# Patient Record
Sex: Female | Born: 1995 | Hispanic: No | Marital: Married | Smoking: Never smoker
Health system: Southern US, Community
[De-identification: ages and names within clinical notes are randomized; demographics above are authoritative.]

## PROBLEM LIST (undated history)

## (undated) ENCOUNTER — Inpatient Hospital Stay (HOSPITAL_COMMUNITY): Payer: Self-pay

## (undated) DIAGNOSIS — Z9289 Personal history of other medical treatment: Secondary | ICD-10-CM

## (undated) DIAGNOSIS — D582 Other hemoglobinopathies: Secondary | ICD-10-CM

## (undated) DIAGNOSIS — F419 Anxiety disorder, unspecified: Secondary | ICD-10-CM

## (undated) DIAGNOSIS — D649 Anemia, unspecified: Secondary | ICD-10-CM

## (undated) DIAGNOSIS — Z5189 Encounter for other specified aftercare: Secondary | ICD-10-CM

## (undated) HISTORY — DX: Personal history of other medical treatment: Z92.89

## (undated) HISTORY — DX: Anemia, unspecified: D64.9

## (undated) HISTORY — PX: NO PAST SURGERIES: SHX2092

---
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
## 2014-01-16 ENCOUNTER — Encounter: Payer: Self-pay | Admitting: Obstetrics & Gynecology

## 2014-01-17 ENCOUNTER — Encounter: Payer: Self-pay | Admitting: *Deleted

## 2014-01-17 DIAGNOSIS — Z9289 Personal history of other medical treatment: Secondary | ICD-10-CM | POA: Insufficient documentation

## 2014-01-17 DIAGNOSIS — D582 Other hemoglobinopathies: Secondary | ICD-10-CM | POA: Insufficient documentation

## 2014-01-17 DIAGNOSIS — Z6791 Unspecified blood type, Rh negative: Secondary | ICD-10-CM

## 2014-01-17 DIAGNOSIS — D573 Sickle-cell trait: Secondary | ICD-10-CM

## 2014-01-17 DIAGNOSIS — Z862 Personal history of diseases of the blood and blood-forming organs and certain disorders involving the immune mechanism: Secondary | ICD-10-CM

## 2014-01-17 DIAGNOSIS — O26899 Other specified pregnancy related conditions, unspecified trimester: Secondary | ICD-10-CM

## 2014-01-17 HISTORY — DX: Personal history of other medical treatment: Z92.89

## 2014-02-08 ENCOUNTER — Encounter: Payer: Self-pay | Admitting: Obstetrics & Gynecology

## 2014-02-08 ENCOUNTER — Ambulatory Visit (INDEPENDENT_AMBULATORY_CARE_PROVIDER_SITE_OTHER): Payer: Medicaid Other | Admitting: Obstetrics & Gynecology

## 2014-02-08 VITALS — BP 112/77 | HR 103 | Temp 98.6°F | Ht 66.0 in | Wt 159.4 lb

## 2014-02-08 DIAGNOSIS — O360990 Maternal care for other rhesus isoimmunization, unspecified trimester, not applicable or unspecified: Secondary | ICD-10-CM

## 2014-02-08 DIAGNOSIS — O26899 Other specified pregnancy related conditions, unspecified trimester: Principal | ICD-10-CM

## 2014-02-08 DIAGNOSIS — Z6791 Unspecified blood type, Rh negative: Secondary | ICD-10-CM

## 2014-02-08 DIAGNOSIS — Z23 Encounter for immunization: Secondary | ICD-10-CM

## 2014-02-08 LAB — POCT URINALYSIS DIP (DEVICE)
Bilirubin Urine: NEGATIVE
GLUCOSE, UA: NEGATIVE mg/dL
Ketones, ur: NEGATIVE mg/dL
Leukocytes, UA: NEGATIVE
NITRITE: NEGATIVE
PROTEIN: NEGATIVE mg/dL
Specific Gravity, Urine: 1.015 (ref 1.005–1.030)
UROBILINOGEN UA: 1 mg/dL (ref 0.0–1.0)
pH: 8.5 — ABNORMAL HIGH (ref 5.0–8.0)

## 2014-02-08 NOTE — Progress Notes (Signed)
Here for initial visit. [facility]. Given new patient information. Discussed BMI/ appropriate weight gain.

## 2014-02-08 NOTE — Progress Notes (Signed)
[facility]. [facility] information given. [facility] Eino FarberWalidah Jerolyn CenterMuhammad, CNM talked to patient about specifics of having a waterbirth Declines Tdap vaccine for today, may reconsider later No other complaints or concerns.  Fetal movement and labor precautions reviewed.

## 2014-02-08 NOTE — Patient Instructions (Signed)
Return to clinic for any obstetric concerns or go to [facility] for evaluation  

## 2014-02-16 ENCOUNTER — Encounter: Payer: Self-pay | Admitting: General Practice

## 2014-02-23 ENCOUNTER — Ambulatory Visit (INDEPENDENT_AMBULATORY_CARE_PROVIDER_SITE_OTHER): Payer: Medicaid Other | Admitting: Advanced Practice Midwife

## 2014-02-23 VITALS — BP 120/72 | HR 99 | Temp 97.9°F | Wt 168.7 lb

## 2014-02-23 DIAGNOSIS — Z6791 Unspecified blood type, Rh negative: Secondary | ICD-10-CM

## 2014-02-23 DIAGNOSIS — O360990 Maternal care for other rhesus isoimmunization, unspecified trimester, not applicable or unspecified: Secondary | ICD-10-CM

## 2014-02-23 DIAGNOSIS — O26899 Other specified pregnancy related conditions, unspecified trimester: Principal | ICD-10-CM

## 2014-02-23 LAB — POCT URINALYSIS DIP (DEVICE)
Bilirubin Urine: NEGATIVE
Glucose, UA: NEGATIVE mg/dL
Ketones, ur: NEGATIVE mg/dL
Leukocytes, UA: NEGATIVE
Nitrite: NEGATIVE
PH: 7.5 (ref 5.0–8.0)
PROTEIN: NEGATIVE mg/dL
SPECIFIC GRAVITY, URINE: 1.015 (ref 1.005–1.030)
UROBILINOGEN UA: 1 mg/dL (ref 0.0–1.0)

## 2014-02-23 LAB — OB RESULTS CONSOLE GC/CHLAMYDIA
Chlamydia: NEGATIVE
Gonorrhea: NEGATIVE

## 2014-02-23 LAB — OB RESULTS CONSOLE GBS: STREP GROUP B AG: NEGATIVE

## 2014-02-23 NOTE — Patient Instructions (Signed)
Braxton Hicks Contractions Pregnancy is commonly associated with contractions of the uterus throughout the pregnancy. Towards the end of pregnancy (32 to 34 weeks), these contractions (Braxton Hicks) can develop more often and may become more forceful. This is not true labor because these contractions do not result in opening (dilatation) and thinning of the cervix. They are sometimes difficult to tell apart from true labor because these contractions can be forceful and people have different pain tolerances. You should not feel embarrassed if you go to the hospital with false labor. Sometimes, the only way to tell if you are in true labor is for your caregiver to follow the changes in the cervix. How to tell the difference between true and false labor:  False labor.  The contractions of false labor are usually shorter, irregular and not as hard as those of true labor.  They are often felt in the front of the lower abdomen and in the groin.  They may leave with walking around or changing positions while lying down.  They get weaker and are shorter lasting as time goes on.  These contractions are usually irregular.  They do not usually become progressively stronger, regular and closer together as with true labor.  True labor.  Contractions in true labor last 30 to 70 seconds, become very regular, usually become more intense, and increase in frequency.  They do not go away with walking.  The discomfort is usually felt in the top of the uterus and spreads to the lower abdomen and low back.  True labor can be determined by your caregiver with an exam. This will show that the cervix is dilating and getting thinner. If there are no prenatal problems or other health problems associated with the pregnancy, it is completely safe to be sent home with false labor and await the onset of true labor. HOME CARE INSTRUCTIONS   Keep up with your usual exercises and instructions.  Take medications as  directed.  Keep your regular prenatal appointment.  Eat and drink lightly if you think you are going into labor.  If BH contractions are making you uncomfortable:  Change your activity position from lying down or resting to walking/walking to resting.  Sit and rest in a tub of warm water.  Drink 2 to 3 glasses of water. Dehydration may cause B-H contractions.  Do slow and deep breathing several times an hour. SEEK IMMEDIATE MEDICAL CARE IF:   Your contractions continue to become stronger, more regular, and closer together.  You have a gushing, burst or leaking of fluid from the vagina.  An oral temperature above 102 F (38.9 C) develops.  You have passage of blood-tinged mucus.  You develop vaginal bleeding.  You develop continuous belly (abdominal) pain.  You have low back pain that you never had before.  You feel the baby's head pushing down causing pelvic pressure.  The baby is not moving as much as it used to. Document Released: 09/07/2005 Document Revised: 11/30/2011 Document Reviewed: 06/19/2013 ExitCare Patient Information 2014 [employer].  Fetal Movement Counts Patient Name: __________________________________________________ Patient Due Date: ____________________ Performing a fetal movement count is highly recommended in high-risk pregnancies, but it is good for every pregnant woman to do. Your caregiver may ask you to start counting fetal movements at 28 weeks of the pregnancy. Fetal movements often increase:  After eating a full meal.  After physical activity.  After eating or drinking something sweet or cold.  At rest. Pay attention to when you feel   the baby is most active. This will help you notice a pattern of your baby's sleep and wake cycles and what factors contribute to an increase in fetal movement. It is important to perform a fetal movement count at the same time each day when your baby is normally most active.  HOW TO COUNT FETAL  MOVEMENTS 1. Find a quiet and comfortable area to sit or lie down on your left side. Lying on your left side provides the best blood and oxygen circulation to your baby. 2. Write down the day and time on a sheet of paper or in a journal. 3. Start counting kicks, flutters, swishes, rolls, or jabs in a 2 hour period. You should feel at least 10 movements within 2 hours. 4. If you do not feel 10 movements in 2 hours, wait 2 3 hours and count again. Look for a change in the pattern or not enough counts in 2 hours. SEEK MEDICAL CARE IF:  You feel less than 10 counts in 2 hours, tried twice.  There is no movement in over an hour.  The pattern is changing or taking longer each day to reach 10 counts in 2 hours.  You feel the baby is not moving as he or she usually does. Date: ____________ Movements: ____________ Start time: ____________ Finish time: ____________  Date: ____________ Movements: ____________ Start time: ____________ Finish time: ____________ Date: ____________ Movements: ____________ Start time: ____________ Finish time: ____________ Date: ____________ Movements: ____________ Start time: ____________ Finish time: ____________ Date: ____________ Movements: ____________ Start time: ____________ Finish time: ____________ Date: ____________ Movements: ____________ Start time: ____________ Finish time: ____________ Date: ____________ Movements: ____________ Start time: ____________ Finish time: ____________ Date: ____________ Movements: ____________ Start time: ____________ Finish time: ____________  Date: ____________ Movements: ____________ Start time: ____________ Finish time: ____________ Date: ____________ Movements: ____________ Start time: ____________ Finish time: ____________ Date: ____________ Movements: ____________ Start time: ____________ Finish time: ____________ Date: ____________ Movements: ____________ Start time: ____________ Finish time: ____________ Date: ____________  Movements: ____________ Start time: ____________ Finish time: ____________ Date: ____________ Movements: ____________ Start time: ____________ Finish time: ____________ Date: ____________ Movements: ____________ Start time: ____________ Finish time: ____________  Date: ____________ Movements: ____________ Start time: ____________ Finish time: ____________ Date: ____________ Movements: ____________ Start time: ____________ Finish time: ____________ Date: ____________ Movements: ____________ Start time: ____________ Finish time: ____________ Date: ____________ Movements: ____________ Start time: ____________ Finish time: ____________ Date: ____________ Movements: ____________ Start time: ____________ Finish time: ____________ Date: ____________ Movements: ____________ Start time: ____________ Finish time: ____________ Date: ____________ Movements: ____________ Start time: ____________ Finish time: ____________  Date: ____________ Movements: ____________ Start time: ____________ Finish time: ____________ Date: ____________ Movements: ____________ Start time: ____________ Finish time: ____________ Date: ____________ Movements: ____________ Start time: ____________ Finish time: ____________ Date: ____________ Movements: ____________ Start time: ____________ Finish time: ____________ Date: ____________ Movements: ____________ Start time: ____________ Finish time: ____________ Date: ____________ Movements: ____________ Start time: ____________ Finish time: ____________ Date: ____________ Movements: ____________ Start time: ____________ Finish time: ____________  Date: ____________ Movements: ____________ Start time: ____________ Finish time: ____________ Date: ____________ Movements: ____________ Start time: ____________ Finish time: ____________ Date: ____________ Movements: ____________ Start time: ____________ Finish time: ____________ Date: ____________ Movements: ____________ Start time:  ____________ Finish time: ____________ Date: ____________ Movements: ____________ Start time: ____________ Finish time: ____________ Date: ____________ Movements: ____________ Start time: ____________ Finish time: ____________ Date: ____________ Movements: ____________ Start time: ____________ Finish time: ____________  Date: ____________ Movements: ____________ Start time: ____________ Finish time: ____________ Date: ____________ Movements: ____________ Start   time: ____________ Finish time: ____________ Date: ____________ Movements: ____________ Start time: ____________ Finish time: ____________ Date: ____________ Movements: ____________ Start time: ____________ Finish time: ____________ Date: ____________ Movements: ____________ Start time: ____________ Finish time: ____________ Date: ____________ Movements: ____________ Start time: ____________ Finish time: ____________ Date: ____________ Movements: ____________ Start time: ____________ Finish time: ____________  Date: ____________ Movements: ____________ Start time: ____________ Finish time: ____________ Date: ____________ Movements: ____________ Start time: ____________ Finish time: ____________ Date: ____________ Movements: ____________ Start time: ____________ Finish time: ____________ Date: ____________ Movements: ____________ Start time: ____________ Finish time: ____________ Date: ____________ Movements: ____________ Start time: ____________ Finish time: ____________ Date: ____________ Movements: ____________ Start time: ____________ Finish time: ____________ Date: ____________ Movements: ____________ Start time: ____________ Finish time: ____________  Date: ____________ Movements: ____________ Start time: ____________ Finish time: ____________ Date: ____________ Movements: ____________ Start time: ____________ Finish time: ____________ Date: ____________ Movements: ____________ Start time: ____________ Finish time: ____________ Date:  ____________ Movements: ____________ Start time: ____________ Finish time: ____________ Date: ____________ Movements: ____________ Start time: ____________ Finish time: ____________ Date: ____________ Movements: ____________ Start time: ____________ Finish time: ____________ Document Released: 10/07/2006 Document Revised: 08/24/2012 Document Reviewed: 07/04/2012 ExitCare Patient Information 2014 [employer].  

## 2014-02-23 NOTE — Progress Notes (Signed)
Reports intermittent lower pelvic pressure. Yellow discharge-- denies itching, burning, irritation.

## 2014-02-23 NOTE — Progress Notes (Signed)
Transfer from [place] for [place]. Went to class in April. Consents signed today. GBS and cultures. Records from [facility] reviewed. Up to date otherwise.

## 2014-02-24 LAB — GC/CHLAMYDIA PROBE AMP
CT PROBE, AMP APTIMA: NEGATIVE
GC Probe RNA: NEGATIVE

## 2014-02-25 ENCOUNTER — Encounter: Payer: Self-pay | Admitting: Advanced Practice Midwife

## 2014-02-25 LAB — CULTURE, BETA STREP (GROUP B ONLY)

## 2014-02-28 ENCOUNTER — Ambulatory Visit: Payer: Medicaid Other | Admitting: Advanced Practice Midwife

## 2014-02-28 ENCOUNTER — Encounter: Payer: Self-pay | Admitting: Advanced Practice Midwife

## 2014-02-28 VITALS — BP 111/70 | HR 89 | Temp 97.6°F | Wt 168.0 lb

## 2014-02-28 DIAGNOSIS — O26899 Other specified pregnancy related conditions, unspecified trimester: Principal | ICD-10-CM

## 2014-02-28 DIAGNOSIS — Z6791 Unspecified blood type, Rh negative: Secondary | ICD-10-CM

## 2014-02-28 DIAGNOSIS — Z3400 Encounter for supervision of normal first pregnancy, unspecified trimester: Secondary | ICD-10-CM

## 2014-02-28 LAB — POCT URINALYSIS DIP (DEVICE)
Bilirubin Urine: NEGATIVE
Bilirubin Urine: NEGATIVE
Glucose, UA: NEGATIVE mg/dL
Glucose, UA: NEGATIVE mg/dL
Ketones, ur: 15 mg/dL — AB
Ketones, ur: 15 mg/dL — AB
LEUKOCYTES UA: NEGATIVE
Leukocytes, UA: NEGATIVE
NITRITE: NEGATIVE
NITRITE: NEGATIVE
PH: 7 (ref 5.0–8.0)
PROTEIN: NEGATIVE mg/dL
Protein, ur: NEGATIVE mg/dL
Specific Gravity, Urine: 1.02 (ref 1.005–1.030)
Specific Gravity, Urine: 1.02 (ref 1.005–1.030)
UROBILINOGEN UA: 1 mg/dL (ref 0.0–1.0)
Urobilinogen, UA: 1 mg/dL (ref 0.0–1.0)
pH: 7 (ref 5.0–8.0)

## 2014-02-28 NOTE — Progress Notes (Signed)
Patient reports pelvic pressure.

## 2014-02-28 NOTE — Progress Notes (Signed)
Doing well. Reviewed cultures and GBS are neg. Has some UCs. Good progression of cervix (pt requested exam)

## 2014-02-28 NOTE — Patient Instructions (Signed)

## 2014-03-07 ENCOUNTER — Encounter: Payer: Self-pay | Admitting: Obstetrics and Gynecology

## 2014-03-07 ENCOUNTER — Ambulatory Visit (INDEPENDENT_AMBULATORY_CARE_PROVIDER_SITE_OTHER): Payer: Medicaid Other | Admitting: Obstetrics and Gynecology

## 2014-03-07 ENCOUNTER — Encounter: Payer: Self-pay | Admitting: *Deleted

## 2014-03-07 VITALS — BP 117/82 | HR 83 | Temp 97.5°F | Wt 172.3 lb

## 2014-03-07 DIAGNOSIS — Z3400 Encounter for supervision of normal first pregnancy, unspecified trimester: Secondary | ICD-10-CM

## 2014-03-07 LAB — POCT URINALYSIS DIP (DEVICE)
Bilirubin Urine: NEGATIVE
Glucose, UA: NEGATIVE mg/dL
Ketones, ur: NEGATIVE mg/dL
Leukocytes, UA: NEGATIVE
Nitrite: NEGATIVE
PROTEIN: NEGATIVE mg/dL
Specific Gravity, Urine: 1.02 (ref 1.005–1.030)
UROBILINOGEN UA: 1 mg/dL (ref 0.0–1.0)
pH: 7 (ref 5.0–8.0)

## 2014-03-07 NOTE — Patient Instructions (Signed)
Third Trimester of Pregnancy The third trimester is from week 29 through week 42, months 7 through 9. The third trimester is a time when the fetus is growing rapidly. At the end of the ninth month, the fetus is about 20 inches in length and weighs 6-10 pounds.  BODY CHANGES Your body goes through many changes during pregnancy. The changes vary from woman to woman.   Your weight will continue to increase. You can expect to gain 25-35 pounds (11-16 kg) by the end of the pregnancy.  You may begin to get stretch marks on your hips, abdomen, and breasts.  You may urinate more often because the fetus is moving lower into your pelvis and pressing on your bladder.  You may develop or continue to have heartburn as a result of your pregnancy.  You may develop constipation because certain hormones are causing the muscles that push waste through your intestines to slow down.  You may develop hemorrhoids or swollen, bulging veins (varicose veins).  You may have pelvic pain because of the weight gain and pregnancy hormones relaxing your joints between the bones in your pelvis. Backaches may result from overexertion of the muscles supporting your posture.  You may have changes in your hair. These can include thickening of your hair, rapid growth, and changes in texture. Some women also have hair loss during or after pregnancy, or hair that feels dry or thin. Your hair will most likely return to normal after your baby is born.  Your breasts will continue to grow and be tender. A yellow discharge may leak from your breasts called colostrum.  Your belly button may stick out.  You may feel short of breath because of your expanding uterus.  You may notice the fetus "dropping," or moving lower in your abdomen.  You may have a bloody mucus discharge. This usually occurs a few days to a week before labor begins.  Your cervix becomes thin and soft (effaced) near your due date. WHAT TO EXPECT AT YOUR PRENATAL  EXAMS  You will have prenatal exams every 2 weeks until week 36. Then, you will have weekly prenatal exams. During a routine prenatal visit:  You will be weighed to make sure you and the fetus are growing normally.  Your blood pressure is taken.  Your abdomen will be measured to track your baby's growth.  The fetal heartbeat will be listened to.  Any test results from the previous visit will be discussed.  You may have a cervical check near your due date to see if you have effaced. At around 36 weeks, your caregiver will check your cervix. At the same time, your caregiver will also perform a test on the secretions of the vaginal tissue. This test is to determine if a type of bacteria, Group B streptococcus, is present. Your caregiver will explain this further. Your caregiver may ask you:  What your birth plan is.  How you are feeling.  If you are feeling the baby move.  If you have had any abnormal symptoms, such as leaking fluid, bleeding, severe headaches, or abdominal cramping.  If you have any questions. Other tests or screenings that may be performed during your third trimester include:  Blood tests that check for low iron levels (anemia).  Fetal testing to check the health, activity level, and growth of the fetus. Testing is done if you have certain medical conditions or if there are problems during the pregnancy. FALSE LABOR You may feel small, irregular contractions that   eventually go away. These are called Braxton Hicks contractions, or false labor. Contractions may last for hours, days, or even weeks before true labor sets in. If contractions come at regular intervals, intensify, or become painful, it is best to be seen by your caregiver.  SIGNS OF LABOR   Menstrual-like cramps.  Contractions that are 5 minutes apart or less.  Contractions that start on the top of the uterus and spread down to the lower abdomen and back.  A sense of increased pelvic pressure or back  pain.  A watery or bloody mucus discharge that comes from the vagina. If you have any of these signs before the 37th week of pregnancy, call your caregiver right away. You need to go to the hospital to get checked immediately. HOME CARE INSTRUCTIONS   Avoid all smoking, herbs, alcohol, and unprescribed drugs. These chemicals affect the formation and growth of the baby.  Follow your caregiver's instructions regarding medicine use. There are medicines that are either safe or unsafe to take during pregnancy.  Exercise only as directed by your caregiver. Experiencing uterine cramps is a good sign to stop exercising.  Continue to eat regular, healthy meals.  Wear a good support bra for breast tenderness.  Do not use hot tubs, steam rooms, or saunas.  Wear your seat belt at all times when driving.  Avoid raw meat, uncooked cheese, cat litter boxes, and soil used by cats. These carry germs that can cause birth defects in the baby.  Take your prenatal vitamins.  Try taking a stool softener (if your caregiver approves) if you develop constipation. Eat more high-fiber foods, such as fresh vegetables or fruit and whole grains. Drink plenty of fluids to keep your urine clear or pale yellow.  Take warm sitz baths to soothe any pain or discomfort caused by hemorrhoids. Use hemorrhoid cream if your caregiver approves.  If you develop varicose veins, wear support hose. Elevate your feet for 15 minutes, 3-4 times a day. Limit salt in your diet.  Avoid heavy lifting, wear low heal shoes, and practice good posture.  Rest a lot with your legs elevated if you have leg cramps or low back pain.  Visit your dentist if you have not gone during your pregnancy. Use a soft toothbrush to brush your teeth and be gentle when you floss.  A sexual relationship may be continued unless your caregiver directs you otherwise.  Do not travel far distances unless it is absolutely necessary and only with the approval  of your caregiver.  Take prenatal classes to understand, practice, and ask questions about the labor and delivery.  Make a trial run to the hospital.  Pack your hospital bag.  Prepare the baby's nursery.  Continue to go to all your prenatal visits as directed by your caregiver. SEEK MEDICAL CARE IF:  You are unsure if you are in labor or if your water has broken.  You have dizziness.  You have mild pelvic cramps, pelvic pressure, or nagging pain in your abdominal area.  You have persistent nausea, vomiting, or diarrhea.  You have a bad smelling vaginal discharge.  You have pain with urination. SEEK IMMEDIATE MEDICAL CARE IF:   You have a fever.  You are leaking fluid from your vagina.  You have spotting or bleeding from your vagina.  You have severe abdominal cramping or pain.  You have rapid weight loss or gain.  You have shortness of breath with chest pain.  You notice sudden or extreme swelling   of your face, hands, ankles, feet, or legs.  You have not felt your baby move in over an hour.  You have severe headaches that do not go away with medicine.  You have vision changes. Document Released: 09/01/2001 Document Revised: 09/12/2013 Document Reviewed: 11/08/2012 ExitCare Patient Information 2015 [employer]. This information is not intended to replace advice given to you by your health care provider. Make sure you discuss any questions you have with your health care provider.  

## 2014-03-07 NOTE — Progress Notes (Signed)
Doing well. Some RLP, occ tightening but no uncomfortable UCs. Good FM. Discussed Micronor.

## 2014-03-07 NOTE — Progress Notes (Signed)
Reports intermittent pelvic pain/pressure and irregular contractions.

## 2014-03-12 ENCOUNTER — Ambulatory Visit (HOSPITAL_COMMUNITY)
Admit: 2014-03-12 | Discharge: 2014-03-12 | Disposition: A | Discharge status: Home / Self Care | Payer: Medicaid Other | Attending: Obstetrics & Gynecology | Admitting: Obstetrics & Gynecology

## 2014-03-12 ENCOUNTER — Encounter (HOSPITAL_COMMUNITY): Payer: Self-pay

## 2014-03-12 ENCOUNTER — Inpatient Hospital Stay (HOSPITAL_COMMUNITY)
Admission: AD | Admit: 2014-03-12 | Discharge: 2014-03-12 | Disposition: A | Discharge status: Home / Self Care | Payer: Medicaid Other | Source: Ambulatory Visit | Attending: Obstetrics & Gynecology | Admitting: Obstetrics & Gynecology

## 2014-03-12 DIAGNOSIS — R609 Edema, unspecified: Secondary | ICD-10-CM

## 2014-03-12 DIAGNOSIS — M7989 Other specified soft tissue disorders: Secondary | ICD-10-CM

## 2014-03-12 DIAGNOSIS — IMO0002 Reserved for concepts with insufficient information to code with codable children: Secondary | ICD-10-CM | POA: Insufficient documentation

## 2014-03-12 HISTORY — DX: Anxiety disorder, unspecified: F41.9

## 2014-03-12 NOTE — MAU Note (Signed)
Patient states for the past few days she has had swelling in the right foot and lower leg that will go away. Today the swelling is worse and is not going away. Denies contractions, bleeding or discharge and reports good fetal movement.

## 2014-03-12 NOTE — MAU Provider Note (Signed)
History     CSN: [id number]  Arrival date and time: 03/12/14 1009   None     Chief Complaint  Patient presents with  . Leg Swelling   HPI This is a 18 y.o. female at 910w2d who presents with c/o right leg/foot swelling more than left. Both swelled starting yesterday. Is on feet all day at work. No pain at all in leg. No redness.   RN Note:  Increase in swelling in rt foot. Usually goes away at night, but was the same when got up this morning. Denies pain, bleeding, leaking, HA, visual changes or epigastric pain       OB History   Grav Para Term Preterm Abortions TAB SAB Ect Mult Living   1               Past Medical History  Diagnosis Date  . Anemia   . Infection     UTI  . Anxiety     no meds    Past Surgical History  Procedure Laterality Date  . No past surgeries      Family History  Problem Relation Age of Onset  . Thyroid disease Mother   . Diabetes Maternal Grandmother   . Cancer Maternal Grandmother     cervical  . Cancer Maternal Grandfather   . Hearing loss Neg Hx     History  Substance Use Topics  . Smoking status: Never Smoker   . Smokeless tobacco: Never Used  . Alcohol Use: No    Allergies:  Allergies  Allergen Reactions  . Diflucan [Fluconazole] Hives    Prescriptions prior to admission  Medication Sig Dispense Refill  . IRON PO Take 1 tablet by mouth daily.      . Prenatal Vit-Fe Fumarate-FA (PRENATAL MULTIVITAMIN) TABS tablet Take 1 tablet by mouth daily at 12 noon.        Review of Systems  Constitutional: Negative for fever, chills and malaise/fatigue.  Cardiovascular: Positive for leg swelling. Negative for chest pain and claudication.  Gastrointestinal: Negative for abdominal pain.  Neurological: Negative for dizziness and focal weakness.   Physical Exam   Blood pressure 119/68, pulse 93, temperature 98.9 F (37.2 C), temperature source Oral, resp. rate 16, last menstrual period 06/10/2013, SpO2 100.00%.  Physical  Exam  Constitutional: She is oriented to person, place, and time. She appears well-developed and well-nourished. No distress.  HENT:  Head: Normocephalic.  Cardiovascular: Normal rate.   Respiratory: Effort normal.  GI: Soft. There is no tenderness.  Musculoskeletal: Normal range of motion. She exhibits edema. She exhibits no tenderness.  Negative Homan's sign No erethema No cords or tenderness Right foot is somewhat more swollen than left   Neurological: She is alert and oriented to person, place, and time.  Skin: Skin is warm and dry.  Psychiatric: She has a normal mood and affect.    MAU Course  Procedures  MDM Doppler study ordered for 3pm today  Results reported as negative per Dr Penne LashLeggett  Assessment and Plan  A:  SIUP at 3410w2d      Leg swelling  P:  Discussed edema       Fluid intake and elevate extremities       [facility] in [facility]12/2014, 11:03 AM   Negative LE dopplers.  Attestation of Attending Supervision of Advanced Practitioner (CNM/NP): Evaluation and management procedures were performed by the Advanced Practitioner under my supervision and collaboration. I have reviewed the Advanced Practitioner's note and chart, and  I agree with the management and plan.  LEGGETT,KELLY H. 5:37 PM

## 2014-03-12 NOTE — Progress Notes (Signed)
VASCULAR LAB PRELIMINARY  PRELIMINARY  PRELIMINARY  PRELIMINARY  Right lower extremity venous duplex    Preliminary report:  Right leg is negative for deep and superficial vein thrombosis.  Report called to Dr. Elsie LincolnKelly Leggett.  NICHOLS, FRANCES, RVT 03/12/2014, 4:33 PM

## 2014-03-12 NOTE — Discharge Instructions (Signed)
Edema  Edema is an abnormal buildup of fluids. It is more common in your legs and thighs. Painless swelling of the feet and ankles is more likely as a person ages. It also is common in looser skin, like around your eyes.  HOME CARE   · Keep the affected body part above the level of the heart while lying down.  · Do not sit still or stand for a long time.  · Do not put anything right under your knees when you lie down.  · Do not wear tight clothes on your upper legs.  · Exercise your legs to help the puffiness (swelling) go down.  · Wear elastic bandages or support stockings as told by your doctor.  · A low-salt diet may help lessen the puffiness.  · Only take medicine as told by your doctor.  GET HELP IF:  · Treatment is not working.  · You have heart, liver, or kidney disease and notice that your skin looks puffy or shiny.  · You have puffiness in your legs that does not get better when you raise your legs.  · You have sudden weight gain for no reason.  GET HELP RIGHT AWAY IF:   · You have shortness of breath or chest pain.  · You cannot breathe when you lie down.  · You have pain, redness, or warmth in the areas that are puffy.  · You have heart, liver, or kidney disease and get edema all of a sudden.  · You have a fever and your symptoms get worse all of a sudden.  MAKE SURE YOU:   · Understand these instructions.  · Will watch your condition.  · Will get help right away if you are not doing well or get worse.  Document Released: 02/24/2008 Document Revised: 09/12/2013 Document Reviewed: 06/30/2013  ExitCare® Patient Information ©2015 [employer]. This information is not intended to replace advice given to you by your health care provider. Make sure you discuss any questions you have with your health care provider.

## 2014-03-12 NOTE — MAU Note (Signed)
Urine in lab 

## 2014-03-12 NOTE — MAU Note (Signed)
Increase in swelling in rt foot.  Usually goes away at night, but was the same when got up this morning. Denies pain, bleeding, leaking, HA, visual changes or epigastric pain

## 2014-03-13 ENCOUNTER — Inpatient Hospital Stay (HOSPITAL_COMMUNITY)
Admission: AD | Admit: 2014-03-13 | Discharge: 2014-03-14 | Disposition: A | Discharge status: Home / Self Care | Payer: Medicaid Other | Source: Ambulatory Visit | Attending: Obstetrics and Gynecology | Admitting: Obstetrics and Gynecology

## 2014-03-13 DIAGNOSIS — O26899 Other specified pregnancy related conditions, unspecified trimester: Secondary | ICD-10-CM

## 2014-03-13 DIAGNOSIS — O479 False labor, unspecified: Secondary | ICD-10-CM | POA: Insufficient documentation

## 2014-03-13 DIAGNOSIS — R109 Unspecified abdominal pain: Secondary | ICD-10-CM

## 2014-03-13 DIAGNOSIS — Z3493 Encounter for supervision of normal pregnancy, unspecified, third trimester: Secondary | ICD-10-CM

## 2014-03-13 NOTE — MAU Note (Signed)
1 cm last week.  Contractions every 4-5 min apart - started at 10 pm.  Watery leaking on the way here.  Denies bleeding.  Baby moving well per pt.

## 2014-03-14 ENCOUNTER — Encounter (HOSPITAL_COMMUNITY): Payer: Self-pay

## 2014-03-14 DIAGNOSIS — O479 False labor, unspecified: Secondary | ICD-10-CM

## 2014-03-14 NOTE — Discharge Instructions (Signed)

## 2014-03-14 NOTE — MAU Provider Note (Signed)
First Provider Initiated Contact with Patient 03/14/14 0032      Chief Complaint:  Labor Eval   Kristine RoyalJasmine Mcdonald is  18 y.o. G1P0 at 4066w4d presents complaining of Labor Eval .  She states regular, every 5-8 minutes contractions are associated with none vaginal bleeding, intact, clear fluid discharge, questions whether LOF, along with active fetal movement.   Obstetrical/Gynecological History: OB History   Grav Para Term Preterm Abortions TAB SAB Ect Mult Living   1              Past Medical History: Past Medical History  Diagnosis Date  . Anemia   . Infection     UTI  . Anxiety     no meds  . Urinary tract infection     Past Surgical History: Past Surgical History  Procedure Laterality Date  . No past surgeries      Family History: Family History  Problem Relation Age of Onset  . Thyroid disease Mother   . Diabetes Maternal Grandmother   . Cancer Maternal Grandmother     cervical  . Cancer Maternal Grandfather   . Hearing loss Neg Hx     Social History: History  Substance Use Topics  . Smoking status: Never Smoker   . Smokeless tobacco: Never Used  . Alcohol Use: No    Allergies:  Allergies  Allergen Reactions  . Diflucan [Fluconazole] Hives    Meds:  Prescriptions prior to admission  Medication Sig Dispense Refill  . IRON PO Take 1 tablet by mouth daily.      . Prenatal Vit-Fe Fumarate-FA (PRENATAL MULTIVITAMIN) TABS tablet Take 1 tablet by mouth daily at 12 noon.        Review of Systems -   Review of Systems  Constitutional: Negative for fever, chills, Eyes: Negative for blurred vision, double vision, photophobia, pain, discharge and redness.  Respiratory: Negative for cough,SOB.   Cardiovascular: Negative for chest pain, palpitations,  Gastrointestinal: ABdominal pain c/w contractions as noted above. Negative for  heartburn, nausea, vomiting, diarrhea, constipation, blood in stool Genitourinary: Negative for dysuria, urgency, frequency,  hematuria and flank pain.    Physical Exam  Last menstrual period 06/10/2013. GENERAL: Well-developed, well-nourished female in no acute distress.  ABDOMEN: Soft, nontender, nondistended, gravid.  EXTREMITIES: Nontender, trace LE edema, 2+ distal pulses. GU: neg pool  CERVICAL EXAM: Dilatation 1 cm   Effacement 40-50%   Station -3 Presentation: cephalic FHT:  Baseline rate 115 bpm   Variability moderate  Accelerations present   Decelerations none Contractions: Every 3-5 mins   Labs: No results found for this or any previous visit (from the past 24 hour(s)). Imaging Studies:  No results found.  Assessment: Kristine RoyalJasmine Mcdonald is  18 y.o. G1P0 at 3966w4d presents with labor check. Neg fern test, neg pool. Cervical exam as above, 1cm NST cat I tracing  Plan: D/C home. Labor precautions reviewed. Return to hospital for decreased/absent FM, LOF/VB, stronger/painful/more frequent ctx, other concerns.   Sunnie Nielsenlexander, Natalie 6/24/20151:16 AM  I have seen and examined this patient and agree with above documentation in the resident's note.   Rulon AbideKeli Beck, M.D. [facility] 03/14/2014 1:46 AM

## 2014-03-15 ENCOUNTER — Ambulatory Visit (INDEPENDENT_AMBULATORY_CARE_PROVIDER_SITE_OTHER): Payer: Medicaid Other | Admitting: Advanced Practice Midwife

## 2014-03-15 VITALS — BP 115/75 | HR 86 | Wt 170.0 lb

## 2014-03-15 DIAGNOSIS — O360131 Maternal care for anti-D [Rh] antibodies, third trimester, fetus 1: Secondary | ICD-10-CM

## 2014-03-15 DIAGNOSIS — O360990 Maternal care for other rhesus isoimmunization, unspecified trimester, not applicable or unspecified: Secondary | ICD-10-CM

## 2014-03-15 DIAGNOSIS — O3090 Multiple gestation, unspecified, unspecified trimester: Secondary | ICD-10-CM

## 2014-03-15 LAB — POCT URINALYSIS DIP (DEVICE)
Bilirubin Urine: NEGATIVE
Glucose, UA: NEGATIVE mg/dL
Ketones, ur: NEGATIVE mg/dL
Leukocytes, UA: NEGATIVE
NITRITE: NEGATIVE
PH: 7.5 (ref 5.0–8.0)
Protein, ur: NEGATIVE mg/dL
Specific Gravity, Urine: 1.015 (ref 1.005–1.030)
UROBILINOGEN UA: 1 mg/dL (ref 0.0–1.0)

## 2014-03-15 NOTE — MAU Provider Note (Signed)
Attestation of Attending Supervision of Advanced Practitioner (CNM/NP): Evaluation and management procedures were performed by the Advanced Practitioner under my supervision and collaboration.  I have reviewed the Advanced Practitioner's note and chart, and I agree with the management and plan.  Blu Mcglaun 03/15/2014 9:02 AM

## 2014-03-15 NOTE — Progress Notes (Signed)
Would like cervix checked today.

## 2014-03-15 NOTE — Progress Notes (Signed)
Doing well.  Good fetal movement, denies vaginal bleeding, LOF, regular contractions.  Had episode of contractions 2 minutes apart a few days ago, but no regular contractions since.  Discussed signs of labor/when to come to hospital.  Cervix checked and membranes swept at pt request. Cervix 1.5/60/-3, very posterior.

## 2014-03-16 ENCOUNTER — Inpatient Hospital Stay (HOSPITAL_COMMUNITY)
Admission: AD | Admit: 2014-03-16 | Discharge: 2014-03-17 | Disposition: A | Discharge status: Home / Self Care | Payer: Medicaid Other | Source: Ambulatory Visit | Attending: Obstetrics & Gynecology | Admitting: Obstetrics & Gynecology

## 2014-03-16 DIAGNOSIS — O479 False labor, unspecified: Secondary | ICD-10-CM | POA: Insufficient documentation

## 2014-03-16 NOTE — MAU Note (Signed)
Contractions since 2030 tonight. No bleeding or leaking

## 2014-03-17 ENCOUNTER — Encounter (HOSPITAL_COMMUNITY): Payer: Self-pay | Admitting: *Deleted

## 2014-03-17 DIAGNOSIS — O479 False labor, unspecified: Secondary | ICD-10-CM | POA: Diagnosis not present

## 2014-03-19 ENCOUNTER — Encounter: Payer: Self-pay | Admitting: Obstetrics and Gynecology

## 2014-03-19 ENCOUNTER — Ambulatory Visit (INDEPENDENT_AMBULATORY_CARE_PROVIDER_SITE_OTHER): Payer: Medicaid Other | Admitting: Obstetrics and Gynecology

## 2014-03-19 VITALS — BP 112/78 | HR 93 | Wt 169.6 lb

## 2014-03-19 DIAGNOSIS — O360990 Maternal care for other rhesus isoimmunization, unspecified trimester, not applicable or unspecified: Secondary | ICD-10-CM

## 2014-03-19 DIAGNOSIS — O360131 Maternal care for anti-D [Rh] antibodies, third trimester, fetus 1: Secondary | ICD-10-CM

## 2014-03-19 DIAGNOSIS — O480 Post-term pregnancy: Secondary | ICD-10-CM

## 2014-03-19 DIAGNOSIS — O3090 Multiple gestation, unspecified, unspecified trimester: Secondary | ICD-10-CM

## 2014-03-19 LAB — POCT URINALYSIS DIP (DEVICE)
Bilirubin Urine: NEGATIVE
Glucose, UA: NEGATIVE mg/dL
KETONES UR: NEGATIVE mg/dL
Leukocytes, UA: NEGATIVE
NITRITE: NEGATIVE
PH: 6.5 (ref 5.0–8.0)
Protein, ur: NEGATIVE mg/dL
Specific Gravity, Urine: 1.025 (ref 1.005–1.030)
Urobilinogen, UA: 0.2 mg/dL (ref 0.0–1.0)

## 2014-03-19 NOTE — Progress Notes (Signed)
Patient is doing well without complaints FM/labor precautions reviewed. Cx 2/60/-3/very posterior. Will schedule postdate induction on Saturday. Postdate fetal testing today NST reviewed and reactive

## 2014-03-21 ENCOUNTER — Telehealth (HOSPITAL_COMMUNITY): Payer: Self-pay | Admitting: *Deleted

## 2014-03-21 ENCOUNTER — Encounter (HOSPITAL_COMMUNITY): Payer: Self-pay | Admitting: *Deleted

## 2014-03-21 NOTE — Telephone Encounter (Signed)
Preadmission screen  

## 2014-03-22 ENCOUNTER — Ambulatory Visit (INDEPENDENT_AMBULATORY_CARE_PROVIDER_SITE_OTHER): Payer: Medicaid Other | Admitting: *Deleted

## 2014-03-22 VITALS — BP 107/63 | HR 88

## 2014-03-22 DIAGNOSIS — O480 Post-term pregnancy: Secondary | ICD-10-CM

## 2014-03-22 LAB — US OB FOLLOW UP

## 2014-03-23 ENCOUNTER — Encounter (HOSPITAL_COMMUNITY): Payer: Self-pay

## 2014-03-23 ENCOUNTER — Inpatient Hospital Stay (HOSPITAL_COMMUNITY)
Admission: AD | Admit: 2014-03-23 | Discharge: 2014-03-23 | Disposition: A | Discharge status: Home / Self Care | Payer: Medicaid Other | Source: Ambulatory Visit | Attending: Obstetrics & Gynecology | Admitting: Obstetrics & Gynecology

## 2014-03-23 ENCOUNTER — Inpatient Hospital Stay (HOSPITAL_COMMUNITY): Payer: Medicaid Other

## 2014-03-23 DIAGNOSIS — O479 False labor, unspecified: Secondary | ICD-10-CM | POA: Diagnosis present

## 2014-03-23 HISTORY — DX: Other hemoglobinopathies: D58.2

## 2014-03-23 HISTORY — DX: Encounter for other specified aftercare: Z51.89

## 2014-03-23 NOTE — Discharge Instructions (Signed)
Labor Induction  Labor induction is when steps are taken to cause a pregnant woman to begin the labor process. Most women go into labor on their own between 37 weeks and 42 weeks of the pregnancy. When this does not happen or when there is a medical need, methods may be used to induce labor. Labor induction causes a pregnant woman's uterus to contract. It also causes the cervix to soften (ripen), open (dilate), and thin out (efface). Usually, labor is not induced before 39 weeks of the pregnancy unless there is a problem with the baby or mother.  Before inducing labor, your health care provider will consider a number of factors, including the following:  The medical condition of you and the baby.   How many weeks along you are.   The status of the baby's lung maturity.   The condition of the cervix.   The position of the baby.  WHAT ARE THE REASONS FOR LABOR INDUCTION? Labor may be induced for the following reasons:  The health of the baby or mother is at risk.   The pregnancy is overdue by 1 week or more.   The water breaks but labor does not start on its own.   The mother has a health condition or serious illness, such as high blood pressure, infection, placental abruption, or diabetes.  The amniotic fluid amounts are low around the baby.   The baby is distressed.  Convenience or wanting the baby to be born on a certain date is not a reason for inducing labor. WHAT METHODS ARE USED FOR LABOR INDUCTION? Several methods of labor induction may be used, such as:   Prostaglandin medicine. This medicine causes the cervix to dilate and ripen. The medicine will also start contractions. It can be taken by mouth or by inserting a suppository into the vagina.   Inserting a thin tube (catheter) with a balloon on the end into the vagina to dilate the cervix. Once inserted, the balloon is expanded with water, which causes the cervix to open.   Stripping the membranes. Your health  care provider separates amniotic sac tissue from the cervix, causing the cervix to be stretched and causing the release of a hormone called progesterone. This may cause the uterus to contract. It is often done during an office visit. You will be sent home to wait for the contractions to begin. You will then come in for an induction.   Breaking the water. Your health care provider makes a hole in the amniotic sac using a small instrument. Once the amniotic sac breaks, contractions should begin. This may still take hours to see an effect.   Medicine to trigger or strengthen contractions. This medicine is given through an IV access tube inserted into a vein in your arm.  All of the methods of induction, besides stripping the membranes, will be done in the hospital. Induction is done in the hospital so that you and the baby can be carefully monitored.  HOW LONG DOES IT TAKE FOR LABOR TO BE INDUCED? Some inductions can take up to 2-3 days. Depending on the cervix, it usually takes less time. It takes longer when you are induced early in the pregnancy or if this is your first pregnancy. If a mother is still pregnant and the induction has been going on for 2-3 days, either the mother will be sent home or a cesarean delivery will be needed. WHAT ARE THE RISKS ASSOCIATED WITH LABOR INDUCTION? Some of the risks of induction  include:   Changes in fetal heart rate, such as too high, too low, or erratic.   Fetal distress.   Chance of infection for the mother and baby.   Increased chance of having a cesarean delivery.   Breaking off (abruption) of the placenta from the uterus (rare).   Uterine rupture (very rare).  When induction is needed for medical reasons, the benefits of induction may outweigh the risks. WHAT ARE SOME REASONS FOR NOT INDUCING LABOR? Labor induction should not be done if:   It is shown that your baby does not tolerate labor.   You have had previous surgeries on your  uterus, such as a myomectomy or the removal of fibroids.   Your placenta lies very low in the uterus and blocks the opening of the cervix (placenta previa).   Your baby is not in a head-down position.   The umbilical cord drops down into the birth canal in front of the baby. This could cut off the baby's blood and oxygen supply.   You have had a previous cesarean delivery.   There are unusual circumstances, such as the baby being extremely premature.  Document Released: 01/27/2007 Document Revised: 05/10/2013 Document Reviewed: 04/06/2013 [facility] Patient Information 2015 [employer], [place]. This information is not intended to replace advice given to you by your health care provider. Make sure you discuss any questions you have with your health care provider.   Fetal Movement Counts Patient Name: __________________________________________________ Patient Due Date: ____________________ Performing a fetal movement count is highly recommended in high-risk pregnancies, but it is good for every pregnant woman to do. Your caregiver may ask you to start counting fetal movements at 28 weeks of the pregnancy. Fetal movements often increase:  After eating a full meal.  After physical activity.  After eating or drinking something sweet or cold.  At rest. Pay attention to when you feel the baby is most active. This will help you notice a pattern of your baby's sleep and wake cycles and what factors contribute to an increase in fetal movement. It is important to perform a fetal movement count at the same time each day when your baby is normally most active.  HOW TO COUNT FETAL MOVEMENTS 1. Find a quiet and comfortable area to sit or lie down on your left side. Lying on your left side provides the best blood and oxygen circulation to your baby. 2. Write down the day and time on a sheet of paper or in a journal. 3. Start counting kicks, flutters, swishes, rolls, or jabs in a 2 hour period. You  should feel at least 10 movements within 2 hours. 4. If you do not feel 10 movements in 2 hours, wait 2-3 hours and count again. Look for a change in the pattern or not enough counts in 2 hours. SEEK MEDICAL CARE IF:  You feel less than 10 counts in 2 hours, tried twice.  There is no movement in over an hour.  The pattern is changing or taking longer each day to reach 10 counts in 2 hours.  You feel the baby is not moving as he or she usually does. Date: ____________ Movements: ____________ Start time: ____________ Doreatha MartinFinish time: ____________  Date: ____________ Movements: ____________ Start time: ____________ Doreatha MartinFinish time: ____________ Date: ____________ Movements: ____________ Start time: ____________ Doreatha MartinFinish time: ____________ Date: ____________ Movements: ____________ Start time: ____________ Doreatha MartinFinish time: ____________ Date: ____________ Movements: ____________ Start time: ____________ Doreatha MartinFinish time: ____________ Date: ____________ Movements: ____________ Start time: ____________ Doreatha MartinFinish time: ____________  Date: ____________ Movements: ____________ Start time: ____________ Doreatha MartinFinish time: ____________ Date: ____________ Movements: ____________ Start time: ____________ Doreatha MartinFinish time: ____________  Date: ____________ Movements: ____________ Start time: ____________ Doreatha MartinFinish time: ____________ Date: ____________ Movements: ____________ Start time: ____________ Doreatha MartinFinish time: ____________ Date: ____________ Movements: ____________ Start time: ____________ Doreatha MartinFinish time: ____________ Date: ____________ Movements: ____________ Start time: ____________ Doreatha MartinFinish time: ____________ Date: ____________ Movements: ____________ Start time: ____________ Doreatha MartinFinish time: ____________ Date: ____________ Movements: ____________ Start time: ____________ Doreatha MartinFinish time: ____________ Date: ____________ Movements: ____________ Start time: ____________ Doreatha MartinFinish time: ____________  Date: ____________ Movements: ____________ Start time:  ____________ Doreatha MartinFinish time: ____________ Date: ____________ Movements: ____________ Start time: ____________ Doreatha MartinFinish time: ____________ Date: ____________ Movements: ____________ Start time: ____________ Doreatha MartinFinish time: ____________ Date: ____________ Movements: ____________ Start time: ____________ Doreatha MartinFinish time: ____________ Date: ____________ Movements: ____________ Start time: ____________ Doreatha MartinFinish time: ____________ Date: ____________ Movements: ____________ Start time: ____________ Doreatha MartinFinish time: ____________ Date: ____________ Movements: ____________ Start time: ____________ Doreatha MartinFinish time: ____________  Date: ____________ Movements: ____________ Start time: ____________ Doreatha MartinFinish time: ____________ Date: ____________ Movements: ____________ Start time: ____________ Doreatha MartinFinish time: ____________ Date: ____________ Movements: ____________ Start time: ____________ Doreatha MartinFinish time: ____________ Date: ____________ Movements: ____________ Start time: ____________ Doreatha MartinFinish time: ____________ Date: ____________ Movements: ____________ Start time: ____________ Doreatha MartinFinish time: ____________ Date: ____________ Movements: ____________ Start time: ____________ Doreatha MartinFinish time: ____________ Date: ____________ Movements: ____________ Start time: ____________ Doreatha MartinFinish time: ____________  Date: ____________ Movements: ____________ Start time: ____________ Doreatha MartinFinish time: ____________ Date: ____________ Movements: ____________ Start time: ____________ Doreatha MartinFinish time: ____________ Date: ____________ Movements: ____________ Start time: ____________ Doreatha MartinFinish time: ____________ Date: ____________ Movements: ____________ Start time: ____________ Doreatha MartinFinish time: ____________ Date: ____________ Movements: ____________ Start time: ____________ Doreatha MartinFinish time: ____________ Date: ____________ Movements: ____________ Start time: ____________ Doreatha MartinFinish time: ____________ Date: ____________ Movements: ____________ Start time: ____________ Doreatha MartinFinish time: ____________  Date:  ____________ Movements: ____________ Start time: ____________ Doreatha MartinFinish time: ____________ Date: ____________ Movements: ____________ Start time: ____________ Doreatha MartinFinish time: ____________ Date: ____________ Movements: ____________ Start time: ____________ Doreatha MartinFinish time: ____________ Date: ____________ Movements: ____________ Start time: ____________ Doreatha MartinFinish time: ____________ Date: ____________ Movements: ____________ Start time: ____________ Doreatha MartinFinish time: ____________ Date: ____________ Movements: ____________ Start time: ____________ Doreatha MartinFinish time: ____________ Date: ____________ Movements: ____________ Start time: ____________ Doreatha MartinFinish time: ____________  Date: ____________ Movements: ____________ Start time: ____________ Doreatha MartinFinish time: ____________ Date: ____________ Movements: ____________ Start time: ____________ Doreatha MartinFinish time: ____________ Date: ____________ Movements: ____________ Start time: ____________ Doreatha MartinFinish time: ____________ Date: ____________ Movements: ____________ Start time: ____________ Doreatha MartinFinish time: ____________ Date: ____________ Movements: ____________ Start time: ____________ Doreatha MartinFinish time: ____________ Date: ____________ Movements: ____________ Start time: ____________ Doreatha MartinFinish time: ____________ Date: ____________ Movements: ____________ Start time: ____________ Doreatha MartinFinish time: ____________  Date: ____________ Movements: ____________ Start time: ____________ Doreatha MartinFinish time: ____________ Date: ____________ Movements: ____________ Start time: ____________ Doreatha MartinFinish time: ____________ Date: ____________ Movements: ____________ Start time: ____________ Doreatha MartinFinish time: ____________ Date: ____________ Movements: ____________ Start time: ____________ Doreatha MartinFinish time: ____________ Date: ____________ Movements: ____________ Start time: ____________ Doreatha MartinFinish time: ____________ Date: ____________ Movements: ____________ Start time: ____________ Doreatha MartinFinish time: ____________ Document Released: 10/07/2006 Document Revised:  08/24/2012 Document Reviewed: 07/04/2012 Exit[employer] Patient Information 2015 [place] [place]. This information is not intended to replace advice given to you by your health care provider. Make sure you discuss any questions you have with your health care provider.

## 2014-03-23 NOTE — MAU Note (Signed)
Contractions tonight. Denies LOF or vaginal bleeding. Positive fetal movement.  

## 2014-03-26 ENCOUNTER — Inpatient Hospital Stay (HOSPITAL_COMMUNITY)
Admission: AD | Admit: 2014-03-26 | Discharge: 2014-03-27 | DRG: 775 | Disposition: A | Discharge status: Home / Self Care | Payer: Medicaid Other | Source: Ambulatory Visit | Attending: Family Medicine | Admitting: Family Medicine

## 2014-03-26 ENCOUNTER — Inpatient Hospital Stay (HOSPITAL_COMMUNITY): Admission: RE | Admit: 2014-03-26 | Payer: Medicaid Other | Source: Ambulatory Visit

## 2014-03-26 ENCOUNTER — Encounter (HOSPITAL_COMMUNITY): Payer: Self-pay | Admitting: *Deleted

## 2014-03-26 ENCOUNTER — Inpatient Hospital Stay (HOSPITAL_COMMUNITY): Payer: Medicaid Other | Admitting: Anesthesiology

## 2014-03-26 ENCOUNTER — Encounter (HOSPITAL_COMMUNITY): Payer: Medicaid Other | Admitting: Anesthesiology

## 2014-03-26 DIAGNOSIS — D582 Other hemoglobinopathies: Secondary | ICD-10-CM

## 2014-03-26 DIAGNOSIS — D649 Anemia, unspecified: Secondary | ICD-10-CM | POA: Diagnosis present

## 2014-03-26 DIAGNOSIS — Z833 Family history of diabetes mellitus: Secondary | ICD-10-CM

## 2014-03-26 DIAGNOSIS — O360131 Maternal care for anti-D [Rh] antibodies, third trimester, fetus 1: Secondary | ICD-10-CM

## 2014-03-26 DIAGNOSIS — Z862 Personal history of diseases of the blood and blood-forming organs and certain disorders involving the immune mechanism: Secondary | ICD-10-CM

## 2014-03-26 DIAGNOSIS — Z9289 Personal history of other medical treatment: Secondary | ICD-10-CM

## 2014-03-26 DIAGNOSIS — O9902 Anemia complicating childbirth: Principal | ICD-10-CM | POA: Diagnosis present

## 2014-03-26 DIAGNOSIS — O360990 Maternal care for other rhesus isoimmunization, unspecified trimester, not applicable or unspecified: Secondary | ICD-10-CM | POA: Diagnosis present

## 2014-03-26 DIAGNOSIS — IMO0001 Reserved for inherently not codable concepts without codable children: Secondary | ICD-10-CM

## 2014-03-26 DIAGNOSIS — O479 False labor, unspecified: Secondary | ICD-10-CM | POA: Diagnosis present

## 2014-03-26 LAB — CBC
HEMATOCRIT: 31 % — AB (ref 36.0–46.0)
HEMOGLOBIN: 10.7 g/dL — AB (ref 12.0–15.0)
MCH: 28.1 pg (ref 26.0–34.0)
MCHC: 34.5 g/dL (ref 30.0–36.0)
MCV: 81.4 fL (ref 78.0–100.0)
Platelets: 213 10*3/uL (ref 150–400)
RBC: 3.81 MIL/uL — ABNORMAL LOW (ref 3.87–5.11)
RDW: 13.8 % (ref 11.5–15.5)
WBC: 14.1 10*3/uL — ABNORMAL HIGH (ref 4.0–10.5)

## 2014-03-26 LAB — RPR

## 2014-03-26 MED ORDER — CITRIC ACID-SODIUM CITRATE 334-500 MG/5ML PO SOLN
30.0000 mL | ORAL | Status: DC | PRN
  Administered 2014-03-26: 30 mL via ORAL
  Filled 2014-03-26: qty 15

## 2014-03-26 MED ORDER — OXYCODONE-ACETAMINOPHEN 5-325 MG PO TABS: 1.0000 | ORAL_TABLET | ORAL | Status: DC | PRN

## 2014-03-26 MED ORDER — OXYCODONE-ACETAMINOPHEN 5-325 MG PO TABS
1.0000 | ORAL_TABLET | ORAL | Status: DC | PRN
  Administered 2014-03-26 – 2014-03-27 (×2): 1 via ORAL
  Filled 2014-03-26 (×2): qty 1

## 2014-03-26 MED ORDER — LACTATED RINGERS IV SOLN
INTRAVENOUS | Status: DC
  Administered 2014-03-26 (×4): via INTRAVENOUS

## 2014-03-26 MED ORDER — LIDOCAINE HCL (PF) 1 % IJ SOLN
INTRAMUSCULAR | Status: DC | PRN
  Administered 2014-03-26 (×2): 8 mL

## 2014-03-26 MED ORDER — WITCH HAZEL-GLYCERIN EX PADS: 1.0000 "application " | MEDICATED_PAD | CUTANEOUS | Status: DC | PRN

## 2014-03-26 MED ORDER — LIDOCAINE HCL (PF) 1 % IJ SOLN
30.0000 mL | INTRAMUSCULAR | Status: DC | PRN
  Filled 2014-03-26: qty 30

## 2014-03-26 MED ORDER — PHENYLEPHRINE 40 MCG/ML (10ML) SYRINGE FOR IV PUSH (FOR BLOOD PRESSURE SUPPORT)
80.0000 ug | PREFILLED_SYRINGE | INTRAVENOUS | Status: DC | PRN
  Filled 2014-03-26: qty 10
  Filled 2014-03-26: qty 2

## 2014-03-26 MED ORDER — LACTATED RINGERS IV SOLN
500.0000 mL | Freq: Once | INTRAVENOUS | Status: AC
  Administered 2014-03-26: 500 mL via INTRAVENOUS

## 2014-03-26 MED ORDER — DIPHENHYDRAMINE HCL 50 MG/ML IJ SOLN: 12.5000 mg | INTRAMUSCULAR | Status: DC | PRN

## 2014-03-26 MED ORDER — DIBUCAINE 1 % RE OINT: 1.0000 "application " | TOPICAL_OINTMENT | RECTAL | Status: DC | PRN

## 2014-03-26 MED ORDER — ACETAMINOPHEN 325 MG PO TABS: 650.0000 mg | ORAL_TABLET | ORAL | Status: DC | PRN

## 2014-03-26 MED ORDER — LANOLIN HYDROUS EX OINT: TOPICAL_OINTMENT | CUTANEOUS | Status: DC | PRN

## 2014-03-26 MED ORDER — FENTANYL 2.5 MCG/ML BUPIVACAINE 1/10 % EPIDURAL INFUSION (WH - ANES)
14.0000 mL/h | INTRAMUSCULAR | Status: DC | PRN
  Administered 2014-03-26: 14 mL/h via EPIDURAL
  Filled 2014-03-26: qty 125

## 2014-03-26 MED ORDER — DIPHENHYDRAMINE HCL 25 MG PO CAPS: 25.0000 mg | ORAL_CAPSULE | Freq: Four times a day (QID) | ORAL | Status: DC | PRN

## 2014-03-26 MED ORDER — PHENYLEPHRINE 40 MCG/ML (10ML) SYRINGE FOR IV PUSH (FOR BLOOD PRESSURE SUPPORT)
80.0000 ug | PREFILLED_SYRINGE | INTRAVENOUS | Status: DC | PRN
  Filled 2014-03-26: qty 2

## 2014-03-26 MED ORDER — IBUPROFEN 600 MG PO TABS
600.0000 mg | ORAL_TABLET | Freq: Four times a day (QID) | ORAL | Status: DC
  Administered 2014-03-26 – 2014-03-27 (×4): 600 mg via ORAL
  Filled 2014-03-26 (×4): qty 1

## 2014-03-26 MED ORDER — LACTATED RINGERS IV SOLN
500.0000 mL | INTRAVENOUS | Status: DC | PRN
  Administered 2014-03-26: 500 mL via INTRAVENOUS
  Administered 2014-03-26: 1000 mL via INTRAVENOUS
  Administered 2014-03-26: 500 mL via INTRAVENOUS

## 2014-03-26 MED ORDER — PRENATAL MULTIVITAMIN CH
1.0000 | ORAL_TABLET | Freq: Every day | ORAL | Status: DC
  Administered 2014-03-27: 1 via ORAL
  Filled 2014-03-26: qty 1

## 2014-03-26 MED ORDER — ONDANSETRON HCL 4 MG/2ML IJ SOLN
4.0000 mg | Freq: Four times a day (QID) | INTRAMUSCULAR | Status: DC | PRN
  Administered 2014-03-26: 4 mg via INTRAVENOUS
  Filled 2014-03-26: qty 2

## 2014-03-26 MED ORDER — EPHEDRINE 5 MG/ML INJ
10.0000 mg | INTRAVENOUS | Status: DC | PRN
  Filled 2014-03-26: qty 2

## 2014-03-26 MED ORDER — FENTANYL 2.5 MCG/ML BUPIVACAINE 1/10 % EPIDURAL INFUSION (WH - ANES)
INTRAMUSCULAR | Status: DC | PRN
  Administered 2014-03-26: 14 mL/h via EPIDURAL

## 2014-03-26 MED ORDER — ONDANSETRON HCL 4 MG/2ML IJ SOLN: 4.0000 mg | INTRAMUSCULAR | Status: DC | PRN

## 2014-03-26 MED ORDER — FENTANYL CITRATE 0.05 MG/ML IJ SOLN
100.0000 ug | INTRAMUSCULAR | Status: DC | PRN
  Administered 2014-03-26 (×3): 100 ug via INTRAVENOUS
  Filled 2014-03-26 (×3): qty 2

## 2014-03-26 MED ORDER — SIMETHICONE 80 MG PO CHEW: 80.0000 mg | CHEWABLE_TABLET | ORAL | Status: DC | PRN

## 2014-03-26 MED ORDER — OXYTOCIN BOLUS FROM INFUSION
500.0000 mL | INTRAVENOUS | Status: DC
  Administered 2014-03-26: 500 mL via INTRAVENOUS

## 2014-03-26 MED ORDER — ONDANSETRON HCL 4 MG PO TABS: 4.0000 mg | ORAL_TABLET | ORAL | Status: DC | PRN

## 2014-03-26 MED ORDER — ZOLPIDEM TARTRATE 5 MG PO TABS: 5.0000 mg | ORAL_TABLET | Freq: Every evening | ORAL | Status: DC | PRN

## 2014-03-26 MED ORDER — TETANUS-DIPHTH-ACELL PERTUSSIS 5-2.5-18.5 LF-MCG/0.5 IM SUSP: 0.5000 mL | Freq: Once | INTRAMUSCULAR | Status: DC

## 2014-03-26 MED ORDER — BENZOCAINE-MENTHOL 20-0.5 % EX AERO: 1.0000 "application " | INHALATION_SPRAY | CUTANEOUS | Status: DC | PRN

## 2014-03-26 MED ORDER — OXYTOCIN 40 UNITS IN LACTATED RINGERS INFUSION - SIMPLE MED
62.5000 mL/h | INTRAVENOUS | Status: DC
  Filled 2014-03-26: qty 1000

## 2014-03-26 MED ORDER — IBUPROFEN 600 MG PO TABS: 600.0000 mg | ORAL_TABLET | Freq: Four times a day (QID) | ORAL | Status: DC | PRN

## 2014-03-26 MED ORDER — SENNOSIDES-DOCUSATE SODIUM 8.6-50 MG PO TABS
2.0000 | ORAL_TABLET | ORAL | Status: DC
  Administered 2014-03-26: 2 via ORAL
  Filled 2014-03-26: qty 2

## 2014-03-26 NOTE — H&P (Signed)
Attestation of Attending Supervision of Advanced Practitioner (CNM/NP): Evaluation and management procedures were performed by the Advanced Practitioner under my supervision and collaboration.  I have reviewed the Advanced Practitioner's note and chart, and I agree with the management and plan.  Jazzmyn Filion 03/26/2014 7:32 AM

## 2014-03-26 NOTE — Anesthesia Preprocedure Evaluation (Signed)
Anesthesia Evaluation  Patient identified by MRN, date of birth, ID band Patient awake    Reviewed: Allergy & Precautions, H&P , NPO status , Patient's Chart, lab work & pertinent test results  Airway Mallampati: I  TM Distance: >3 FB Neck ROM: full    Dental no notable dental hx.    Pulmonary neg pulmonary ROS,    Pulmonary exam normal       Cardiovascular negative cardio ROS      Neuro/Psych negative neurological ROS     GI/Hepatic negative GI ROS, Neg liver ROS,   Endo/Other  negative endocrine ROS  Renal/GU negative Renal ROS     Musculoskeletal   Abdominal Normal abdominal exam  (+)   Peds  Hematology negative hematology ROS (+)   Anesthesia Other Findings   Reproductive/Obstetrics (+) Pregnancy                             Anesthesia Physical Anesthesia Plan  ASA: II  Anesthesia Plan: Epidural   Post-op Pain Management:    Induction:   Airway Management Planned:   Additional Equipment:   Intra-op Plan:   Post-operative Plan:   Informed Consent: I have reviewed the patients History and Physical, chart, labs and discussed the procedure including the risks, benefits and alternatives for the proposed anesthesia with the patient or authorized representative who has indicated his/her understanding and acceptance.     Plan Discussed with:   Anesthesia Plan Comments:         Anesthesia Quick Evaluation  

## 2014-03-26 NOTE — H&P (Signed)
Vicki RoyalJasmine Sturdivant is a 18 y.o. female G1 @ 41.2wks by LMP and confirmed by 12wk scan presenting for labor eval. Denies leaking or bldg. [facility] when [facility] anticipating a waterbirth. Her preg has been essentially unremarkable other than 1) Hgb C trait 2) hx anemia with blood tx x 3 3) Rh neg. She History OB History   Grav Para Term Preterm Abortions TAB SAB Ect Mult Living   1              Past Medical History  Diagnosis Date  . Anemia   . Infection     UTI  . Anxiety     no meds  . Urinary tract infection   . Hemoglobin C trait   . Blood transfusion without reported diagnosis 2014    x3   Past Surgical History  Procedure Laterality Date  . No past surgeries     Family History: family history includes Cancer in her maternal grandfather and maternal grandmother; Diabetes in her maternal grandmother; Thyroid disease in her mother. There is no history of Hearing loss. Social History:  reports that she has never smoked. She has never used smokeless tobacco. She reports that she does not drink alcohol or use illicit drugs.   Prenatal Transfer Tool  Maternal Diabetes: No Genetic Screening: Normal Maternal Ultrasounds/Referrals: Normal Fetal Ultrasounds or other Referrals:  None Maternal Substance Abuse:  No Significant Maternal Medications:  None Significant Maternal Lab Results:  Lab values include: Group B Strep negative Other Comments:  None  ROS  Dilation: 4 Effacement (%): 90 Station: -2 Exam by:: K.WIlosn,RN Blood pressure 115/67, pulse 88, temperature 98.6 F (37 C), temperature source Oral, resp. rate 18, height 5\' 6"  (1.676 m), weight 78.019 kg (172 lb), last menstrual period 06/10/2013. Exam Physical Exam  Constitutional: She is oriented to person, place, and time. She appears well-developed.  HENT:  Head: Normocephalic.  Neck: Normal range of motion.  Cardiovascular: Normal rate.   Respiratory: Effort  normal.  GI:  FHT baseline 118, some 10x10accels, decreased variability Ctx q 2-4 mins  Musculoskeletal: Normal range of motion.  Neurological: She is alert and oriented to person, place, and time.  Skin: Skin is warm and dry.  Psychiatric: She has a normal mood and affect. Her behavior is normal. Thought content normal.    Prenatal labs: ABO, Rh: A/Negative/-- (12/16 0000) Antibody: Positive (12/16 0000) Rubella: Immune (12/16 0000) RPR: Nonreactive (03/27 0000)  HBsAg: Negative (12/16 0000)  HIV: Non-reactive (12/16 0000)  GBS: Negative (06/05 0000)   Assessment/Plan: IUP at 41.2wks Early active labor GBS neg  [facility] Pt is anticipating waterbirth, but at this point with a nonreactive FHT pt is unable to get into the tub. This was explained to pt and family- they are understanding.   Cam HaiSHAW, KIMBERLY CNM 03/26/2014, 4:42 AM

## 2014-03-26 NOTE — Lactation Note (Signed)
This note was copied from the chart of Vicki Karema Mcdonald. Lactation Consultation Note  Initial visit at 11 hours of age. Baby is latched now with wide flanged lips and rhythmic sucking.  Mom is drowsy, offered pilllow support.  [facility] given and discussed.  Encouraged to feed with early cues on demand.  Early newborn behavior discussed.  Hand expression demonstrated with colostrum visible.  Mom to call for assist as needed.      Patient Name: Vicki Mcdonald NWGNF'AToday's Date: 03/26/2014 Reason for consult: Initial assessment   Maternal Data Has patient been taught Hand Expression?: Yes Does the patient have breastfeeding experience prior to this delivery?: No  Feeding Feeding Type: Breast Fed Length of feed: 10 min  LATCH Score/Interventions Latch: Grasps breast easily, tongue down, lips flanged, rhythmical sucking.  Audible Swallowing: A few with stimulation Intervention(s): Hand expression;Skin to skin Intervention(s): Hand expression  Type of Nipple: Everted at rest and after stimulation  Comfort (Breast/Nipple): Soft / non-tender     Hold (Positioning): No assistance needed to correctly position infant at breast. Intervention(s): Breastfeeding basics reviewed;Support Pillows;Position options;Skin to skin  LATCH Score: 9  Lactation Tools Discussed/Used     Consult Status Consult Status: Follow-up Date: 03/27/14 Follow-up type: In-patient    Mcdonald, Arvella MerlesJana Lynn 03/26/2014, 10:29 PM

## 2014-03-26 NOTE — Anesthesia Procedure Notes (Signed)
Epidural Patient location during procedure: OB Start time: 03/26/2014 7:38 AM End time: 03/26/2014 7:42 AM  Staffing Anesthesiologist: Leilani AbleHATCHETT, Kurt Azimi Performed by: anesthesiologist   Preanesthetic Checklist Completed: patient identified, surgical consent, pre-op evaluation, timeout performed, IV checked, risks and benefits discussed and monitors and equipment checked  Epidural Patient position: sitting Prep: site prepped and draped and DuraPrep Patient monitoring: continuous pulse ox and blood pressure Approach: midline Location: L3-L4 Injection technique: LOR air  Needle:  Needle type: Tuohy  Needle gauge: 17 G Needle length: 9 cm and 9 Needle insertion depth: 5 cm cm Catheter type: closed end flexible Catheter size: 19 Gauge Catheter at skin depth: 10 cm Test dose: negative and Other  Assessment Sensory level: T9 Events: blood not aspirated, injection not painful, no injection resistance, negative IV test and no paresthesia  Additional Notes Reason for block:procedure for pain

## 2014-03-26 NOTE — MAU Note (Signed)
Pt reports she has been having ctx since 930 pm  Reports some bloody show and denies SROM . Good fetal movement reported

## 2014-03-26 NOTE — Progress Notes (Signed)
Vicki Mcdonald is a 18 y.o. G1P0 at 6737w2d   Subjective: Just got epidural; decided on that due to inability to get into tub secondary to nonreactive FHR  Objective: BP 120/66  Pulse 78  Temp(Src) 98.4 F (36.9 C) (Oral)  Resp 18  Ht 5\' 6"  (1.676 m)  Wt 78.019 kg (172 lb)  BMI 27.77 kg/m2  LMP 06/10/2013      FHT:  FHR: 118 bpm, variability: minimal ,  accelerations:  Abscent,  decelerations:  Absent, some 10x10 accels UC:   regular, every 2-3 minutes, spontaneous SVE:   Dilation: 5.5 Effacement (%): 90 Station: -2 Exam by:: GPayne RN- exam deferred  Labs: Lab Results  Component Value Date   WBC 14.1* 03/26/2014   HGB 10.7* 03/26/2014   HCT 31.0* 03/26/2014   MCV 81.4 03/26/2014   PLT 213 03/26/2014    Assessment / Plan: Active labor FHR with decreased variability  Will examine cx and place foley once comfortable with epidural Continue to watch FHR  Brix Brearley CNM 03/26/2014, 7:53 AM

## 2014-03-27 LAB — CBC
HCT: 22.7 % — ABNORMAL LOW (ref 36.0–46.0)
Hemoglobin: 7.7 g/dL — ABNORMAL LOW (ref 12.0–15.0)
MCH: 27.6 pg (ref 26.0–34.0)
MCHC: 33.9 g/dL (ref 30.0–36.0)
MCV: 81.4 fL (ref 78.0–100.0)
PLATELETS: 178 10*3/uL (ref 150–400)
RBC: 2.79 MIL/uL — ABNORMAL LOW (ref 3.87–5.11)
RDW: 13.9 % (ref 11.5–15.5)
WBC: 14.2 10*3/uL — AB (ref 4.0–10.5)

## 2014-03-27 MED ORDER — IBUPROFEN 600 MG PO TABS: 600.0000 mg | ORAL_TABLET | Freq: Four times a day (QID) | ORAL | Status: DC

## 2014-03-27 MED ORDER — PRENATAL MULTIVITAMIN CH: 1.0000 | ORAL_TABLET | Freq: Every day | ORAL | Status: DC

## 2014-03-27 NOTE — Progress Notes (Signed)
UR completed 

## 2014-03-27 NOTE — Discharge Instructions (Signed)
Vaginal Delivery °Care After °Refer to this sheet in the next few weeks. These discharge instructions provide you with information on caring for yourself after delivery. Your caregiver may also give you specific instructions. Your treatment has been planned according to the most current medical practices available, but problems sometimes occur. Call your caregiver if you have any problems or questions after you go home. °HOME CARE INSTRUCTIONS °· Take over-the-counter or prescription medicines only as directed by your caregiver or pharmacist. °· Do not drink alcohol, especially if you are breastfeeding or taking medicine to relieve pain. °· Do not chew or smoke tobacco. °· Do not use illegal drugs. °· Continue to use good perineal care. Good perineal care includes: °¨ Wiping your perineum from front to back. °¨ Keeping your perineum clean. °· Do not use tampons or douche until your caregiver says it is okay. °· Shower, wash your hair, and take tub baths as directed by your caregiver. °· Wear a well-fitting bra that provides breast support. °· Eat healthy foods. °· Drink enough fluids to keep your urine clear or pale yellow. °· Eat high-fiber foods such as whole grain cereals and breads, brown rice, beans, and fresh fruits and vegetables every day. These foods may help prevent or relieve constipation. °· Follow your cargiver's recommendations regarding resumption of activities such as climbing stairs, driving, lifting, exercising, or traveling. °· Talk to your caregiver about resuming sexual activities. Resumption of sexual activities is dependent upon your risk of infection, your rate of healing, and your comfort and desire to resume sexual activity. °· Try to have someone help you with your household activities and your newborn for at least a few days after you leave the hospital. °· Rest as much as possible. Try to rest or take a nap when your newborn is sleeping. °· Increase your activities gradually. °· Keep all  of your scheduled postpartum appointments. It is very important to keep your scheduled follow-up appointments. At these appointments, your caregiver will be checking to make sure that you are healing physically and emotionally. °SEEK MEDICAL CARE IF:  °· You are passing large clots from your vagina. Save any clots to show your caregiver. °· You have a foul smelling discharge from your vagina. °· You have trouble urinating. °· You are urinating frequently. °· You have pain when you urinate. °· You have a change in your bowel movements. °· You have increasing redness, pain, or swelling near your vaginal incision (episiotomy) or vaginal tear. °· You have pus draining from your episiotomy or vaginal tear. °· Your episiotomy or vaginal tear is separating. °· You have painful, hard, or reddened breasts. °· You have a severe headache. °· You have blurred vision or see spots. °· You feel sad or depressed. °· You have thoughts of hurting yourself or your newborn. °· You have questions about your care, the care of your newborn, or medicines. °· You are dizzy or lightheaded. °· You have a rash. °· You have nausea or vomiting. °· You were breastfeeding and have not had a menstrual period within 12 weeks after you stopped breastfeeding. °· You are not breastfeeding and have not had a menstrual period by the 12th week after delivery. °· You have a fever. °SEEK IMMEDIATE MEDICAL CARE IF:  °· You have persistent pain. °· You have chest pain. °· You have shortness of breath. °· You faint. °· You have leg pain. °· You have stomach pain. °· Your vaginal bleeding saturates two or more sanitary pads   in 1 hour. °MAKE SURE YOU:  °· Understand these instructions. °· Will watch your condition. °· Will get help right away if you are not doing well or get worse. ° ° °Document Released: 09/04/2000 Document Revised: 06/01/2012 Document Reviewed: 05/04/2012 °ExitCare® Patient Information ©2015 [employer]. This information is not intended to  replace advice given to you by your health care provider. Make sure you discuss any questions you have with your health care provider. ° °

## 2014-03-27 NOTE — Anesthesia Postprocedure Evaluation (Signed)
  Anesthesia Post-op Note  Patient: Vicki Mcdonald  Procedure(s) Performed: * No procedures listed *  Patient Location: Mother/Baby  Anesthesia Type:Epidural  Level of Consciousness: awake, oriented and patient cooperative  Airway and Oxygen Therapy: Patient Spontanous Breathing  Post-op Pain: none  Post-op Assessment: Patient's Cardiovascular Status Stable, Respiratory Function Stable, Patent Airway, No signs of Nausea or vomiting, Adequate PO intake, Pain level controlled, No headache, No backache, No residual numbness and No residual motor weakness  Post-op Vital Signs: Reviewed and stable  Last Vitals:  Filed Vitals:   03/27/14 0626  BP: 95/60  Pulse: 61  Temp: 36.3 C  Resp: 16    Complications: No apparent anesthesia complications

## 2014-03-27 NOTE — Discharge Summary (Signed)
Obstetric Discharge Summary Reason for Admission: onset of labor Prenatal Procedures: NST Intrapartum Procedures: spontaneous vaginal delivery Postpartum Procedures: none rh neg but baby also rh neg Complications-Operative and Postpartum: none Hemoglobin  Date Value Ref Range Status  03/27/2014 7.7* 12.0 - 15.0 g/dL Final     DELTA CHECK NOTED     REPEATED TO VERIFY  12/15/2013 10.6   Final     HCT  Date Value Ref Range Status  03/27/2014 22.7* 36.0 - 46.0 % Final  12/15/2013 32   Final    Physical Exam:  General: alert, cooperative and no distress Lochia: appropriate Uterine Fundus: firm Incision: na DVT Evaluation: No evidence of DVT seen on physical exam. No cords or calf tenderness. No significant calf/ankle edema.  Discharge Diagnoses: Term Pregnancy-delivered  Discharge Information: Date: 03/27/2014 Activity: pelvic rest Diet: routine Medications: PNV and Ibuprofen Condition: stable Instructions: refer to practice specific booklet Discharge to: home Follow-up Information   [facility]. (for postpartum visit)    Specialty:  Obstetrics and Gynecology   Contact information:   [address] [place] [id number] [phone]      Newborn Data: Live born female  Birth Weight: 7 lb 15.7 oz (3620 g) APGAR: 8, 9  Home with mother.  Pt presented in labor and progressed to deliver a liveborn female without complications. Postpartum care was uncomplicated. She is breast feeding and desires POPs for contraception but we discussed nexplanon and likely to want that at postpartum visit.   Syenna Nazir L 03/27/2014, 10:54 AM

## 2014-03-27 NOTE — Discharge Summary (Signed)
Attestation of Attending Supervision of Fellow: Evaluation and management procedures were performed by the Fellow under my supervision and collaboration.  I have reviewed the Fellow's note and chart, and I agree with the management and plan.    

## 2014-03-28 ENCOUNTER — Ambulatory Visit: Payer: Self-pay

## 2014-03-28 NOTE — Lactation Note (Signed)
This note was copied from the chart of [name]. Lactation Consultation Note  Patient Name: [name] ZOXWR'UToday's Date: 03/28/2014 Reason for consult: Follow-up assessment Per mom milk is in and my breast feel so much better than during the night , after the baby fed. LC reviewed basics , sore nipple and engorgement prevention and tx. Mother informed of post-discharge support and given phone number to the lactation department, including services for phone call assistance; out-patient appointments; and breastfeeding support group. List of other breastfeeding resources in the community given in the handout. Encouraged mother to call for problems or concerns related to breastfeeding.   Maternal Data Formula Feeding for Exclusion: No Has patient been taught Hand Expression?: Yes Does the patient have breastfeeding experience prior to this delivery?: No  Feeding Feeding Type: Breast Fed Length of feed: 10 min (per mom )  LATCH Score/Interventions Latch: Grasps breast easily, tongue down, lips flanged, rhythmical sucking. Intervention(s): Skin to skin  Audible Swallowing: Spontaneous and intermittent Intervention(s): Hand expression;Skin to skin  Type of Nipple: Everted at rest and after stimulation  Comfort (Breast/Nipple): Filling, red/small blisters or bruises, mild/mod discomfort  Problem noted: Mild/Moderate discomfort Interventions (Mild/moderate discomfort): Hand expression  Hold (Positioning): Assistance needed to correctly position infant at breast and maintain latch. Intervention(s): Breastfeeding basics reviewed (last fed at 0900 per mom , milk is in )  [facility]: 8  Lactation Tools Discussed/Used Tools: Pump (per m om has a manual pump ) Breast pump type: Manual WIC Program: Yes (per mom )   Consult Status Consult Status: Complete    Kathrin Greathouseorio, Adelee Hannula Ann 03/28/2014, 11:34 AM

## 2014-03-30 LAB — TYPE AND SCREEN
ABO/RH(D): A NEG
Antibody Screen: POSITIVE
DAT, IgG: NEGATIVE
UNIT DIVISION: 0
UNIT DIVISION: 0
Unit division: 0
Unit division: 0

## 2014-04-16 NOTE — Progress Notes (Signed)
NST 03/22/14 reactive 

## 2014-04-23 ENCOUNTER — Encounter: Payer: Self-pay | Admitting: Family Medicine

## 2014-04-23 ENCOUNTER — Ambulatory Visit (INDEPENDENT_AMBULATORY_CARE_PROVIDER_SITE_OTHER): Payer: Medicaid Other | Admitting: Family Medicine

## 2014-04-23 VITALS — BP 127/79 | HR 85 | Temp 98.2°F | Wt 152.8 lb

## 2014-04-23 DIAGNOSIS — Z30014 Encounter for initial prescription of intrauterine contraceptive device: Secondary | ICD-10-CM

## 2014-04-23 DIAGNOSIS — Z30013 Encounter for initial prescription of injectable contraceptive: Secondary | ICD-10-CM

## 2014-04-23 DIAGNOSIS — Z30430 Encounter for insertion of intrauterine contraceptive device: Secondary | ICD-10-CM

## 2014-04-23 LAB — POCT PREGNANCY, URINE: PREG TEST UR: NEGATIVE

## 2014-04-23 MED ORDER — LEVONORGESTREL 20 MCG/24HR IU IUD
INTRAUTERINE_SYSTEM | Freq: Once | INTRAUTERINE | Status: AC
  Administered 2014-04-23: 1 via INTRAUTERINE

## 2014-04-23 NOTE — Patient Instructions (Addendum)
Kegel Exercises The goal of Kegel exercises is to isolate and exercise your pelvic floor muscles. These muscles act as a hammock that supports the rectum, vagina, small intestine, and uterus. As the muscles weaken, the hammock sags and these organs are displaced from their normal positions. Kegel exercises can strengthen your pelvic floor muscles and help you to improve bladder and bowel control, improve sexual response, and help reduce many problems and some discomfort during pregnancy. Kegel exercises can be done anywhere and at any time. HOW TO PERFORM KEGEL EXERCISES 1. Locate your pelvic floor muscles. To do this, squeeze (contract) the muscles that you use when you try to stop the flow of urine. You will feel a tightness in the vaginal area (women) and a tight lift in the rectal area (men and women). 2. When you begin, contract your pelvic muscles tight for 2-5 seconds, then relax them for 2-5 seconds. This is one set. Do 4-5 sets with a short pause in between. 3. Contract your pelvic muscles for 8-10 seconds, then relax them for 8-10 seconds. Do 4-5 sets. If you cannot contract your pelvic muscles for 8-10 seconds, try 5-7 seconds and work your way up to 8-10 seconds. Your goal is 4-5 sets of 10 contractions each day. Keep your stomach, buttocks, and legs relaxed during the exercises. Perform sets of both short and long contractions. Vary your positions. Perform these contractions 3-4 times per day. Perform sets while you are:   Lying in bed in the morning.  Standing at lunch.  Sitting in the late afternoon.  Lying in bed at night. You should do 40-50 contractions per day. Do not perform more Kegel exercises per day than recommended. Overexercising can cause muscle fatigue. Continue these exercises for for at least 15-20 weeks or as directed by your caregiver. Document Released: 08/24/2012 Document Reviewed: 08/24/2012 [facility] Patient Information 2015 [place], [place]. This information is  not intended to replace advice given to you by your health care provider. Make sure you discuss any questions you have with your health care provider.    Intrauterine Device Insertion, Care After Refer to this sheet in the next few weeks. These instructions provide you with information on caring for yourself after your procedure. Your health care provider may also give you more specific instructions. Your treatment has been planned according to current medical practices, but problems sometimes occur. Call your health care provider if you have any problems or questions after your procedure. WHAT TO EXPECT AFTER THE PROCEDURE Insertion of the IUD may cause some discomfort, such as cramping. The cramping should improve after the IUD is in place. You may have bleeding after the procedure. This is normal. It varies from light spotting for a few days to menstrual-like bleeding. When the IUD is in place, a string will extend past the cervix into the vagina for 1-2 inches. The strings should not bother you or your partner. If they do, talk to your health care provider.  HOME CARE INSTRUCTIONS   Check your intrauterine device (IUD) to make sure it is in place before you resume sexual activity. You should be able to feel the strings. If you cannot feel the strings, something may be wrong. The IUD may have fallen out of the uterus, or the uterus may have been punctured (perforated) during placement. Also, if the strings are getting longer, it may mean that the IUD is being forced out of the uterus. You no longer have full protection from pregnancy if any of  these problems occur.  You may resume sexual intercourse if you are not having problems with the IUD. The copper IUD is considered immediately effective, and the hormone IUD works right away if inserted within 7 days of your period starting. You will need to use a backup method of birth control for 7 days if the IUD in inserted at any other time in your  cycle.  Continue to check that the IUD is still in place by feeling for the strings after every menstrual period.  You may need to take pain medicine such as acetaminophen or ibuprofen. Only take medicines as directed by your health care provider. SEEK MEDICAL CARE IF:   You have bleeding that is heavier or lasts longer than a normal menstrual cycle.  You have a fever.  You have increasing cramps or abdominal pain not relieved with medicine.  You have abdominal pain that does not seem to be related to the same area of earlier cramping and pain.  You are lightheaded, unusually weak, or faint.  You have abnormal vaginal discharge or smells.  You have pain during sexual intercourse.  You cannot feel the IUD strings, or the IUD string has gotten longer.  You feel the IUD at the opening of the cervix in the vagina.  You think you are pregnant, or you miss your menstrual period.  The IUD string is hurting your sex partner. MAKE SURE YOU:  Understand these instructions.  Will watch your condition.  Will get help right away if you are not doing well or get worse. Document Released: 05/06/2011 Document Revised: 06/28/2013 Document Reviewed: 02/26/2013 [facility] Patient Information 2015 [place]. This information is not intended to replace advice given to you by your health care provider. Make sure you discuss any questions you have with your health care provider.

## 2014-04-23 NOTE — Progress Notes (Signed)
Subjective:    Vicki Mcdonald is a 18 y.o. 451P1001 Caucasian female who presents for a postpartum visit. She is 4 weeks postpartum following a spontaneous vaginal delivery. I have fully reviewed the prenatal and intrapartum course. The delivery was at 41.2 gestational weeks. Outcome: spontaneous vaginal delivery. Anesthesia: epidural. Postpartum course has been uncomplicated. Baby's course has been uncomplicated. Baby is feeding by breast. Bleeding staining only. Bowel function is normal. Bladder function is normal. Patient is not sexually active. Contraception method is IUD. Postpartum depression screening: negative.  The following portions of the patient's history were reviewed and updated as appropriate: allergies, current medications, past medical history, past surgical history and problem list.  Review of Systems Pertinent items are noted in HPI.   Filed Vitals:   04/23/14 1439  BP: 127/79  Pulse: 85  Temp: 98.2 F (36.8 C)  Weight: 152 lb 12.8 oz (69.31 kg)    Objective:     General:  alert, cooperative and no distress   Breasts:  deferred, no complaints  Lungs: clear to auscultation bilaterally  Heart:  regular rate and rhythm  Abdomen: soft, nontender   Vulva: normal  Vagina: normal vagina  Cervix:  closed  Corpus: Well-involuted  Adnexa:  Non-palpable  Rectal Exam: no hemorrhoids        Not sexually active yet, pregnancy test today was neg  The risks and benefits of the method and placement have been thouroughly reviewed with the patient and all questions were answered.  Specifically the patient is aware of failure rate of 09/998, expulsion of the IUD and of possible perforation.  The patient is aware of irregular bleeding due to the method and understands the incidence of irregular bleeding diminishes with time.  Signed copy of informed consent in chart.   Time out was performed.  A Pederson speculum was placed in the vagina.  The cervix was visualized, prepped  using Betadin, and grasped with a single tooth tenaculum. The uterus was found to be anteroflexed and it sounded to 7 cm.  Mirena IUD placed per manufacturer's recommendations.   The strings were trimmed to 3 cm.  Sonogram was performed and the proper placement of the IUD was verified via transvaginal u/s.   The patient was given post procedure instructions, including signs and symptoms of infection and to check for the strings after each menses or each month, and refraining from intercourse or anything in the vagina for 3 days.  She was given a Mirena care card with date Mirena placed, and date Mirena to be removed.  She is scheduled for a return appointment after her first menses or 4-6 weeks.  Vicki Mcdonald ROCIO 04/23/2014 3:36 PM  Assessment:   normal postpartum exam 4w s/p TSVD Depression screening Contraception counseling   Plan:   Contraception: IUD Follow up in: 6 weeks for IUD insertion or as needed.

## 2014-04-27 ENCOUNTER — Encounter: Payer: Self-pay | Admitting: *Deleted

## 2014-05-11 ENCOUNTER — Encounter: Payer: Self-pay | Admitting: General Practice

## 2014-06-04 ENCOUNTER — Telehealth: Payer: Self-pay | Admitting: *Deleted

## 2014-06-04 ENCOUNTER — Ambulatory Visit: Payer: Medicaid Other | Admitting: Nurse Practitioner

## 2014-06-04 NOTE — Telephone Encounter (Signed)
Contacted patient, informed of missed appointment.  Pt states she was unaware of appointment and will call in am to reschedule.

## 2014-07-23 ENCOUNTER — Encounter: Payer: Self-pay | Admitting: Family Medicine

## 2014-08-24 ENCOUNTER — Encounter: Payer: Self-pay | Admitting: Obstetrics & Gynecology

## 2015-02-06 ENCOUNTER — Telehealth: Payer: Self-pay | Admitting: General Practice

## 2015-02-06 NOTE — Telephone Encounter (Signed)
Patient called and left message stating she has an IUD and has already had a period this month and has started another period, please call back. Called patient and she states that started last night she started to see a little pink d/c when she wipes, states she is also having some cramping and some pressure as well down there. Asked patient if she was having difficulty urinating or burning when she pees. Patient denies. Asked patient if she has had intercourse recently and patient states yes. Told patient it can be normal to spot after intercourse especially if it was rough. Told patient to keep an eye on it to see if it stops over the next couple days. Told patient if it becomes heavy like a period to call us back. Patient verbalized understanding and had no other questions

## 2015-03-18 ENCOUNTER — Telehealth: Payer: Self-pay

## 2015-03-18 NOTE — Telephone Encounter (Signed)
Patient called stating she has questions about her IUD because she has been having irregular bleeding. Called patient who states she had Mirena placed last August and has been having regular periods but this period was three days late a little heavier and she experienced more cramping. Informed patient that this is likely normal and advised she take ibuprofen as needed for cramping. Patient verbalized understanding. No further questions or concerns.

## 2015-12-11 ENCOUNTER — Telehealth: Payer: Self-pay

## 2015-12-11 NOTE — Telephone Encounter (Signed)
Pt called and stated that she has the IUD and has been experiencing mood swings and dizziness.  She also wanted to know if she can get pregnant on the IUD?

## 2015-12-18 NOTE — Telephone Encounter (Signed)
[facility] she has had her IUD in about 2 years, has had a period about 3 weeks ago and then the next week had dizziness, mood swings like when she was pregnant. We discussed pregnancy is unlikely with IUD but possible- suggested she take a home pregnancy test first thing in the morning when it is more accurate- if negative then most likely just hormonal changes women have. If positive - call to make appointment to be seen.  Also advised if negative- but keeps on having these symptoms monthly and wants to be seen - to call and make appointment. She voices understanding.

## 2016-01-10 ENCOUNTER — Ambulatory Visit: Payer: Medicaid Other | Admitting: Family Medicine

## 2016-02-10 ENCOUNTER — Ambulatory Visit: Payer: Medicaid Other | Admitting: Obstetrics and Gynecology

## 2016-04-01 ENCOUNTER — Ambulatory Visit: Payer: Self-pay | Admitting: Obstetrics & Gynecology

## 2016-06-16 ENCOUNTER — Encounter (HOSPITAL_COMMUNITY): Payer: Self-pay | Admitting: *Deleted

## 2016-06-16 ENCOUNTER — Inpatient Hospital Stay (HOSPITAL_COMMUNITY)
Admission: AD | Admit: 2016-06-16 | Discharge: 2016-06-16 | Disposition: A | Discharge status: Home / Self Care | Payer: Self-pay | Source: Ambulatory Visit | Attending: Family Medicine | Admitting: Family Medicine

## 2016-06-16 DIAGNOSIS — O209 Hemorrhage in early pregnancy, unspecified: Secondary | ICD-10-CM

## 2016-06-16 DIAGNOSIS — Z3A01 Less than 8 weeks gestation of pregnancy: Secondary | ICD-10-CM | POA: Insufficient documentation

## 2016-06-16 LAB — URINALYSIS, ROUTINE W REFLEX MICROSCOPIC
Bilirubin Urine: NEGATIVE
GLUCOSE, UA: NEGATIVE mg/dL
Ketones, ur: NEGATIVE mg/dL
LEUKOCYTES UA: NEGATIVE
NITRITE: NEGATIVE
PH: 6 (ref 5.0–8.0)
Protein, ur: NEGATIVE mg/dL
SPECIFIC GRAVITY, URINE: 1.015 (ref 1.005–1.030)

## 2016-06-16 LAB — HCG, QUANTITATIVE, PREGNANCY: hCG, Beta Chain, Quant, S: 5 m[IU]/mL — ABNORMAL HIGH (ref <5)

## 2016-06-16 LAB — URINE MICROSCOPIC-ADD ON: WBC, UA: NONE SEEN WBC/hpf (ref 0–5)

## 2016-06-16 NOTE — MAU Provider Note (Signed)
History     CSN: [id number]  Arrival date and time: 06/16/16 1454   First Provider Initiated Contact with Patient 06/16/16 1524      Chief Complaint  Patient presents with  . Vaginal Bleeding   Vicki Mcdonald is a 20 y.o. [id number] presenting with onset today of vaginal bleeding and report of positive HPT x2, 4 days ago and yesterday. PMP 05/14/2012. Bleeding is typical of normal menstrual flow. Menstrual interval usually every 28 days. Denies abdominal cramping or pain. Desires pregnancy.   Rh neg  OB History  Gravida Para Term Preterm AB Living  3 1 1   1 1   SAB TAB Ectopic Multiple Live Births  1       1    # Outcome Date GA Lbr Len/2nd Weight Sex Delivery Anes PTL Lv  3 Term 03/26/14 [redacted]w[redacted]d 09:38 / 01:49 3.62 kg (7 lb 15.7 oz) F Vag-Spont EPI  LIV  2 Gravida           1 SAB                Past Medical History:  Diagnosis Date  . Anemia   . Anxiety    no meds  . Blood transfusion without reported diagnosis 2014   x3  . Hemoglobin C trait (HCC)   . Infection    UTI  . Urinary tract infection     Past Surgical History:  Procedure Laterality Date  . NO PAST SURGERIES      Family History  Problem Relation Age of Onset  . Thyroid disease Mother   . Diabetes Maternal Grandmother   . Cancer Maternal Grandmother     cervical  . Cancer Maternal Grandfather   . Hearing loss Neg Hx     Social History  Substance Use Topics  . Smoking status: Never Smoker  . Smokeless tobacco: Never Used  . Alcohol use No    Allergies:  Allergies  Allergen Reactions  . Diflucan [Fluconazole] Hives    Prescriptions Prior to Admission  Medication Sig Dispense Refill Last Dose  . ibuprofen (ADVIL,MOTRIN) 600 MG tablet Take 1 tablet (600 mg total) by mouth every 6 (six) hours. 30 tablet 0 Taking  . Prenatal Vit-Fe Fumarate-FA (PRENATAL MULTIVITAMIN) TABS tablet Take 1 tablet by mouth daily at 12 noon. 30 tablet 12 Not Taking    Review of Systems  Constitutional: Negative  for fever and malaise/fatigue.  Gastrointestinal: Negative for nausea and vomiting.  Genitourinary: Negative for dysuria.       No abnormal or irritative vaginal discharge   Physical Exam   Blood pressure 125/81, pulse 67, temperature 98.5 F (36.9 C), resp. rate 18, height 5\' 6"  (1.676 m), weight 73.5 kg (162 lb), last menstrual period 05/14/2016, currently breastfeeding.  Physical Exam  Nursing note and vitals reviewed. Constitutional: She appears well-developed and well-nourished. No distress.  HENT:  Head: Normocephalic.  Eyes: Pupils are equal, round, and reactive to light.  Neck: Neck supple.  Cardiovascular: Normal rate.   Respiratory: Effort normal.  GI: Soft. She exhibits no mass. There is no tenderness.  Genitourinary:  Genitourinary Comments: Pelvic deferred    MAU Course  Procedures  Results for orders placed or performed during the hospital encounter of 06/16/16 (from the past 24 hour(s))  Urinalysis, Routine w reflex microscopic (not at [facility])     Status: Abnormal   Collection Time: 06/16/16  3:10 PM  Result Value Ref Range   Color, Urine YELLOW YELLOW   APPearance  CLEAR CLEAR   Specific Gravity, Urine 1.015 1.005 - 1.030   pH 6.0 5.0 - 8.0   Glucose, UA NEGATIVE NEGATIVE mg/dL   Hgb urine dipstick SMALL (A) NEGATIVE   Bilirubin Urine NEGATIVE NEGATIVE   Ketones, ur NEGATIVE NEGATIVE mg/dL   Protein, ur NEGATIVE NEGATIVE mg/dL   Nitrite NEGATIVE NEGATIVE   Leukocytes, UA NEGATIVE NEGATIVE  Urine microscopic-add on     Status: Abnormal   Collection Time: 06/16/16  3:10 PM  Result Value Ref Range   Squamous Epithelial / LPF 0-5 (A) NONE SEEN   WBC, UA NONE SEEN 0 - 5 WBC/hpf   RBC / HPF 0-5 0 - 5 RBC/hpf   Bacteria, UA RARE (A) NONE SEEN  hCG, quantitative, pregnancy     Status: Abnormal   Collection Time: 06/16/16  3:36 PM  Result Value Ref Range   hCG, Beta Chain, Quant, S 5 (H) <5 mIU/mL   MDM Consulted Dr. Shawnie PonsPratt re: full work-up or follow up  quantitative beta hCG in 2 days> follow quant Discharge with bleeding and abdominal pain precautions  Assessment and Plan   1. Bleeding in early pregnancy   Borderline positive Quantitative beta hCG    Medication List    TAKE these medications   prenatal multivitamin Tabs tablet Take 1 tablet by mouth daily at 12 noon.      Follow-up Information    [facility].   Specialty:  Obstetrics and Gynecology Why:  8:00 am Contact information: [address] [place] [id number] [phone]         POE,DEIRDRE 06/16/2016, 3:36 PM

## 2016-06-16 NOTE — MAU Note (Signed)
Have had vag bleeding since 1100. Used couple panty liners but now has in tampon. No pain

## 2016-06-16 NOTE — Discharge Instructions (Signed)

## 2016-06-19 ENCOUNTER — Other Ambulatory Visit: Payer: Self-pay

## 2016-06-19 ENCOUNTER — Inpatient Hospital Stay (HOSPITAL_COMMUNITY)
Admission: AD | Admit: 2016-06-19 | Discharge: 2016-06-19 | Disposition: A | Discharge status: Home / Self Care | Payer: Self-pay | Source: Ambulatory Visit | Attending: Obstetrics and Gynecology | Admitting: Obstetrics and Gynecology

## 2016-06-19 ENCOUNTER — Telehealth: Payer: Self-pay

## 2016-06-19 DIAGNOSIS — Z3202 Encounter for pregnancy test, result negative: Secondary | ICD-10-CM

## 2016-06-19 LAB — HCG, QUANTITATIVE, PREGNANCY: HCG, BETA CHAIN, QUANT, S: 1 m[IU]/mL (ref <5)

## 2016-06-19 NOTE — MAU Note (Signed)
Pt presents to MAU for repeat QUANT. Denies any pain, reports vaginal bleeding

## 2016-06-19 NOTE — Telephone Encounter (Signed)
Patient missed her appointment this morning for her repeat beta. I have called and left a message for patient to call back to reschedule appointment.

## 2016-06-19 NOTE — Discharge Instructions (Signed)
Preparing for Pregnancy Before trying to become pregnant, make an appointment with your health care provider (preconception care). The goal is to help you have a healthy, safe pregnancy. At your first appointment, your health care provider will:   Do a complete physical exam, including a Pap test.  Take a complete medical history.  Give you advice and help you resolve any problems. PRECONCEPTION CHECKLIST Here is a list of the basics to cover with your health care provider at your preconception visit:  Medical history.  Tell your health care provider about any diseases you have had. Many diseases can affect your pregnancy.  Include your partner's medical history and family history.  Make sure you have been tested for sexually transmitted infections (STIs). These can affect your pregnancy. In some cases, they can be passed to your baby. Tell your health care provider about any history of STIs.  Make sure your health care provider knows about any previous problems you have had with conception or pregnancy.  Tell your health care provider about any medicine you take. This includes herbal supplements and over-the-counter medicines.  Make sure all your immunizations are up to date. You may need to make additional appointments.  Ask your health care provider if you need any vaccinations or if there are any you should avoid.  Diet.  It is especially important to eat a healthy, balanced diet with the right nutrients when you are pregnant.  Ask your health care provider to help you get to a healthy weight before pregnancy.  If you are overweight, you are at higher risk for certain complications. These include high blood pressure, diabetes, and preterm birth.  If you are underweight, you are more likely to have a low-birth-weight baby.  Lifestyle.  Tell your health care provider about lifestyle factors such as alcohol use, drug use, or smoking.  Describe any harmful substances you may  be exposed to at work or home. These can include chemicals, pesticides, and radiation.  Mental health.  Let your health care provider know if you have been feeling depressed or anxious.  Let your health care provider know if you have a history of substance abuse.  Let your health care provider know if you do not feel safe at home. HOME INSTRUCTIONS TO PREPARE FOR PREGNANCY Follow your health care provider's advice and instructions.   Keep an accurate record of your menstrual periods. This makes it easier for your health care provider to determine your baby's due date.  Begin taking prenatal vitamins and folic acid supplements daily. Take them as directed by your health care provider.  Eat a balanced diet. Get help from a nutrition counselor if you have questions or need help.  Get regular exercise. Try to be active for at least 30 minutes a day most days of the week.  Quit smoking, if you smoke.  Do not drink alcohol.  Do not take illegal drugs.  Get medical problems, such as diabetes or high blood pressure, under control.  If you have diabetes, make sure you do the following:  Have good blood sugar control. If you have type 1 diabetes, use multiple daily doses of insulin. Do not use split-dose or premixed insulin.  Have an eye exam by a qualified eye care professional trained in caring for people with diabetes.  Get evaluated by your health care provider for cardiovascular disease.  Get to a healthy weight. If you are overweight or obese, reduce your weight with the help of a qualified health   professional such as a registered dietitian. Ask your health care provider what the right weight range is for you. HOW DO I KNOW I AM PREGNANT? You may be pregnant if you have been sexually active and you miss your period. Symptoms of early pregnancy include:   Mild cramping.  Very light vaginal bleeding (spotting).  Feeling unusually tired.  Morning sickness. If you have any of  these symptoms, take a home pregnancy test. These tests look for a hormone called human chorionic gonadotropin (hCG) in your urine. Your body begins to make this hormone during early pregnancy. These tests are very accurate. Wait until at least the first day you miss your period to take one. If you get a positive result, call your health care provider to make appointments for prenatal care. WHAT SHOULD I DO IF I BECOME PREGNANT?  Make an appointment with your health care provider by week 12 of your pregnancy at the latest.  Do not smoke. Smoking can be harmful to your baby.  Do not drink alcoholic beverages. Alcohol is related to a number of birth defects.  Avoid toxic odors and chemicals.  You may continue to have sexual intercourse if it does not cause pain or other problems, such as vaginal bleeding.   This information is not intended to replace advice given to you by your health care provider. Make sure you discuss any questions you have with your health care provider.   Document Released: 08/20/2008 Document Revised: 09/28/2014 Document Reviewed: 08/14/2013 Elsevier Interactive Patient Education 2016 Elsevier Inc.  

## 2016-06-19 NOTE — MAU Provider Note (Signed)
History   [id number]   Chief Complaint  Patient presents with  . BHCG    HPI Vicki Mcdonald is a 20 y.o. female G3P1011 here for follow-up BHCG.  Upon review of the records patient was first seen on 9/27 for vaginal bleeding after positive pregnancy test. During that visit pt had negative UPT & BHCG was 5. Pt had not missed menses. Was discharged home and told to f/u this morning in [facility] for repeat BHCG. Denies abdominal pain. Reports continued vaginal bleeding that is appropriate for menses.   Patient's last menstrual period was 05/14/2016.  OB History  Gravida Para Term Preterm AB Living  3 1 1   1 1   SAB TAB Ectopic Multiple Live Births  1       1    # Outcome Date GA Lbr Len/2nd Weight Sex Delivery Anes PTL Lv  3 Term 03/26/14 [redacted]w[redacted]d 09:38 / 01:49 7 lb 15.7 oz (3.62 kg) F Vag-Spont EPI  LIV  2 Gravida           1 SAB               Past Medical History:  Diagnosis Date  . Anemia   . Anxiety    no meds  . Blood transfusion without reported diagnosis 2014   x3  . Hemoglobin C trait (HCC)   . Infection    UTI  . Urinary tract infection     Family History  Problem Relation Age of Onset  . Thyroid disease Mother   . Diabetes Maternal Grandmother   . Cancer Maternal Grandmother     cervical  . Cancer Maternal Grandfather   . Hearing loss Neg Hx     Social History   Social History  . Marital status: Married    Spouse name: N/A  . Number of children: N/A  . Years of education: N/A   Social History Main Topics  . Smoking status: Never Smoker  . Smokeless tobacco: Never Used  . Alcohol use No  . Drug use: No  . Sexual activity: Yes    Birth control/ protection: None   Other Topics Concern  . Not on file   Social History Narrative  . No narrative on file    Allergies  Allergen Reactions  . Diflucan [Fluconazole] Hives    No current facility-administered medications on file prior to encounter.    Current Outpatient Prescriptions on File Prior to  Encounter  Medication Sig Dispense Refill  . Prenatal Vit-Fe Fumarate-FA (PRENATAL MULTIVITAMIN) TABS tablet Take 1 tablet by mouth daily at 12 noon. 30 tablet 12     Physical Exam   Vitals:   06/19/16 1533  BP: 126/74  Pulse: 60  Resp: 18  Temp: 97.8 F (36.6 C)    Physical Exam  Constitutional: She is oriented to person, place, and time. She appears well-developed and well-nourished. No distress.  HENT:  Head: Normocephalic and atraumatic.  Cardiovascular: Normal rate.   Respiratory: Effort normal. No respiratory distress.  Musculoskeletal: Normal range of motion.  Neurological: She is alert and oriented to person, place, and time.  Skin: She is not diaphoretic.  Psychiatric: She has a normal mood and affect. Her behavior is normal. Judgment and thought content normal.    MAU Course  Procedures Results for orders placed or performed during the hospital encounter of 06/19/16 (from the past 24 hour(s))  hCG, quantitative, pregnancy     Status: None   Collection Time: 06/19/16  3:11  PM  Result Value Ref Range   hCG, Beta Chain, Quant, S 1 <5 mIU/mL    MDM BHCG negative  Assessment and Plan  20 y.o. G3P1011 at Unknown wks Pregnancy Follow-up BHCG 1. Negative pregnancy test    P: Discharge home F/u with PCP for routine care   Judeth HornErin Victorious Cosio, NP 06/19/2016 4:26 PM

## 2016-08-31 ENCOUNTER — Ambulatory Visit (INDEPENDENT_AMBULATORY_CARE_PROVIDER_SITE_OTHER): Payer: Medicaid Other | Admitting: Obstetrics and Gynecology

## 2016-08-31 ENCOUNTER — Other Ambulatory Visit (HOSPITAL_COMMUNITY)
Admission: RE | Admit: 2016-08-31 | Discharge: 2016-08-31 | Disposition: A | Discharge status: Home / Self Care | Payer: Medicaid Other | Source: Ambulatory Visit | Attending: Obstetrics and Gynecology | Admitting: Obstetrics and Gynecology

## 2016-08-31 ENCOUNTER — Encounter: Payer: Self-pay | Admitting: Obstetrics and Gynecology

## 2016-08-31 VITALS — BP 121/75 | HR 83 | Wt 156.0 lb

## 2016-08-31 DIAGNOSIS — Z3491 Encounter for supervision of normal pregnancy, unspecified, first trimester: Secondary | ICD-10-CM

## 2016-08-31 DIAGNOSIS — Z113 Encounter for screening for infections with a predominantly sexual mode of transmission: Secondary | ICD-10-CM | POA: Insufficient documentation

## 2016-08-31 DIAGNOSIS — D582 Other hemoglobinopathies: Secondary | ICD-10-CM

## 2016-08-31 DIAGNOSIS — Z01419 Encounter for gynecological examination (general) (routine) without abnormal findings: Secondary | ICD-10-CM | POA: Insufficient documentation

## 2016-08-31 DIAGNOSIS — Z6791 Unspecified blood type, Rh negative: Secondary | ICD-10-CM

## 2016-08-31 DIAGNOSIS — O26899 Other specified pregnancy related conditions, unspecified trimester: Principal | ICD-10-CM

## 2016-08-31 DIAGNOSIS — O360910 Maternal care for other rhesus isoimmunization, first trimester, not applicable or unspecified: Secondary | ICD-10-CM

## 2016-08-31 DIAGNOSIS — Z3689 Encounter for other specified antenatal screening: Secondary | ICD-10-CM | POA: Diagnosis not present

## 2016-08-31 DIAGNOSIS — Z3490 Encounter for supervision of normal pregnancy, unspecified, unspecified trimester: Secondary | ICD-10-CM

## 2016-08-31 NOTE — Progress Notes (Addendum)
New OB Note  08/31/2016   [facility]  Chief Complaint: NOB  Transfer of Care Patient: no  History of Present Illness: [name] is a 20 y.o. G3P1011 @ 11/1 weeks (EDC 7/1, based on Patient's last menstrual period was 06/14/2016.=11wk u/s), with the above CC. Preg complicated by has Hemoglobin C trait (HCC); Rh negative state in antepartum period; and Supervision of normal pregnancy on her problem list.   Her periods were: regular qmonth  She was using no method when she conceived. IUD removed approx one month prior She has Negative signs or symptoms of miscarriage or preterm labor On any different medications around the time she conceived/early pregnancy: No   ROS: A 12-point review of systems was performed and negative, except as stated in the above HPI.  OBGYN History: As per HPI. OB History  Gravida Para Term Preterm AB Living  3 1 1  0 1 1  SAB TAB Ectopic Multiple Live Births  1 0 0 0 1    # Outcome Date GA Lbr Len/2nd Weight Sex Delivery Anes PTL Lv  3 Current           2 Term 03/26/14 6590w2d 09:38 / 01:49 7 lb 15.7 oz (3.62 kg) F Vag-Spont EPI  LIV  1 SAB              Any issues with any prior pregnancies: no Any prior children are healthy, doing well, without any problems or issues: yes History of pap smears: Yes.. Abnormal: no   Past Medical History: Past Medical History:  Diagnosis Date  . Anemia   . Anxiety    no meds  . Blood transfusion without reported diagnosis 2014   x3  . Hemoglobin C trait (HCC)   . History of blood transfusion 01/17/2014   X 3 09/2012   . Infection    UTI  . Urinary tract infection     Past Surgical History: Past Surgical History:  Procedure Laterality Date  . NO PAST SURGERIES      Family History:  Family History  Problem Relation Age of Onset  . Thyroid disease Mother   . Diabetes Maternal Grandmother   . Cancer Maternal Grandmother     cervical  . Cancer Maternal Grandfather   . Hearing loss Neg  Hx    She denies any history of mental retardation, birth defects or genetic disorders in her or the FOB's history  Social History:  Social History   Social History  . Marital status: Married    Spouse name: N/A  . Number of children: N/A  . Years of education: N/A   Occupational History  . Not on file.   Social History Main Topics  . Smoking status: Never Smoker  . Smokeless tobacco: Never Used  . Alcohol use No  . Drug use: No  . Sexual activity: Yes    Birth control/ protection: None   Other Topics Concern  . Not on file   Social History Narrative  . No narrative on file    Allergy: Allergies  Allergen Reactions  . Diflucan [Fluconazole] Hives   Current Outpatient Medications: PNV  Physical Exam:   BP 121/75   Pulse 83   Wt 156 lb (70.8 kg)   LMP 06/14/2016   BMI 25.18 kg/m  Body mass index is 25.18 kg/m.  General appearance: Well nourished, well developed female in no acute distress.  Neck:  Supple, normal appearance, and no thyromegaly  Cardiovascular: S1, S2  normal, no murmur, rub or gallop, regular rate and rhythm Respiratory:  Clear to auscultation bilateral. Normal respiratory effort Abdomen: positive bowel sounds and no masses, hernias; diffusely non tender to palpation, non distended Neuro/Psych:  Normal mood and affect.  Skin:  Warm and dry.  Lymphatic:  No inguinal lymphadenopathy.   Pelvic exam: is not limited by body habitus EGBUS: within normal limits, Vagina: within normal limits and with no blood in the vault, Cervix: normal appearing cervix without discharge or lesions, closed/long/high, Uterus:  enlarged, c/w 12 week size, and Adnexa:  normal adnexa and no mass, fullness, tenderness  Laboratory: none  Imaging:  11/1 by bedside u/s with SLIUP  Assessment: Pt doing well  Plan: 1. Encounter for supervision of normal pregnancy, antepartum, unspecified gravidity Routine care. Pt amenable to genetic screening and pt set up for  anatomy scan at 18-20wks. Pt to come back for AFP only after 15wks - US MFM OB COMP + 14 WK; Future - Culture, OB Urine - US bedside; Future - Cytology - PAP - GC/Chlamydia probe amp ([place] - Cystic fibrosis diagnostic study - Prenatal Profile - Pain Mgmt, Profile 6 Conf w/o mM, U - US MFM Fetal Nuchal Translucency; Future  2. Rh negative state in antepartum period Type and scree today. Rhogam at 28wks and prn  3. Hemoglobin C trait (HCC) Counseled on FOB to get tested UCx q trimester  Problem list reviewed and updated.  Follow up per babyscripts schedule   >50% of 20 min visit spent on counseling and coordination of care.     Vicki Copaharlie Anibal Mcdonald, Jr. MD Attending [facility](Faculty Practice)

## 2016-08-31 NOTE — Progress Notes (Signed)
Bedside US shows single IUP measuring 7815w3d and FHR 168.

## 2016-08-31 NOTE — Progress Notes (Signed)
Pt requests first trimester screening.

## 2016-09-01 ENCOUNTER — Encounter (HOSPITAL_COMMUNITY): Payer: Self-pay | Admitting: Obstetrics and Gynecology

## 2016-09-01 LAB — GC/CHLAMYDIA PROBE AMP (~~LOC~~) NOT AT ARMC
Chlamydia: NEGATIVE
Neisseria Gonorrhea: NEGATIVE

## 2016-09-01 LAB — CYTOLOGY - PAP: Diagnosis: NEGATIVE

## 2016-09-01 LAB — CULTURE, OB URINE: Organism ID, Bacteria: NO GROWTH

## 2016-09-02 LAB — PAIN MGMT, PROFILE 6 CONF W/O MM, U
6 ACETYLMORPHINE: NEGATIVE ng/mL (ref <10)
ALCOHOL METABOLITES: NEGATIVE ng/mL (ref <500)
Amphetamines: NEGATIVE ng/mL (ref <500)
BENZODIAZEPINES: NEGATIVE ng/mL (ref <100)
Barbiturates: NEGATIVE ng/mL (ref <300)
Cocaine Metabolite: NEGATIVE ng/mL (ref <150)
Creatinine: 75.1 mg/dL (ref >=20.0)
METHADONE METABOLITE: NEGATIVE ng/mL (ref <100)
Marijuana Metabolite: NEGATIVE ng/mL (ref <20)
Opiates: NEGATIVE ng/mL (ref <100)
Oxidant: NEGATIVE ug/mL (ref <200)
Oxycodone: NEGATIVE ng/mL (ref <100)
PHENCYCLIDINE: NEGATIVE ng/mL (ref <25)
Please note:: 0
pH: 6.73 (ref 4.5–9.0)

## 2016-09-10 ENCOUNTER — Encounter (HOSPITAL_COMMUNITY): Payer: Self-pay

## 2016-09-10 ENCOUNTER — Other Ambulatory Visit: Payer: Self-pay | Admitting: Obstetrics and Gynecology

## 2016-09-10 ENCOUNTER — Ambulatory Visit (HOSPITAL_COMMUNITY)
Admission: RE | Admit: 2016-09-10 | Discharge: 2016-09-10 | Disposition: A | Discharge status: Home / Self Care | Payer: Medicaid Other | Source: Ambulatory Visit | Attending: Obstetrics and Gynecology | Admitting: Obstetrics and Gynecology

## 2016-09-10 DIAGNOSIS — Z3A12 12 weeks gestation of pregnancy: Secondary | ICD-10-CM | POA: Diagnosis not present

## 2016-09-10 DIAGNOSIS — Z3491 Encounter for supervision of normal pregnancy, unspecified, first trimester: Secondary | ICD-10-CM | POA: Diagnosis not present

## 2016-09-10 DIAGNOSIS — Z3490 Encounter for supervision of normal pregnancy, unspecified, unspecified trimester: Secondary | ICD-10-CM

## 2016-09-10 DIAGNOSIS — Z3682 Encounter for antenatal screening for nuchal translucency: Secondary | ICD-10-CM

## 2016-09-10 NOTE — Addendum Note (Signed)
Encounter addended by: Lestine MountSybil Daiki Dicostanzo, RT on: 09/10/2016  3:53 PM<BR>    Actions taken: Imaging Exam ended

## 2016-09-12 ENCOUNTER — Other Ambulatory Visit: Payer: Self-pay | Admitting: Obstetrics and Gynecology

## 2016-09-16 ENCOUNTER — Encounter: Payer: Self-pay | Admitting: *Deleted

## 2016-09-16 LAB — PRENATAL PROFILE (SOLSTAS)
ANTIBODY SCREEN: NEGATIVE
BASOS ABS: 0 {cells}/uL (ref 0–200)
Basophils Relative: 0 %
EOS PCT: 2 %
Eosinophils Absolute: 178 cells/uL (ref 15–500)
HEMATOCRIT: 36.6 % (ref 35.0–45.0)
HEMOGLOBIN: 12.1 g/dL (ref 11.7–15.5)
HEP B S AG: NEGATIVE
HIV 1&2 Ab, 4th Generation: NONREACTIVE
LYMPHS ABS: 1958 {cells}/uL (ref 850–3900)
Lymphocytes Relative: 22 %
MCH: 30.4 pg (ref 27.0–33.0)
MCHC: 33.1 g/dL (ref 32.0–36.0)
MCV: 92 fL (ref 80.0–100.0)
MONOS PCT: 6 %
MPV: 11.3 fL (ref 7.5–12.5)
Monocytes Absolute: 534 cells/uL (ref 200–950)
NEUTROS ABS: 6230 {cells}/uL (ref 1500–7800)
NEUTROS PCT: 70 %
Platelets: 223 10*3/uL (ref 140–400)
RBC: 3.98 MIL/uL (ref 3.80–5.10)
RDW: 13.7 % (ref 11.0–15.0)
RH TYPE: NEGATIVE
RUBELLA: 1.94 {index} — AB (ref <0.90)
WBC: 8.9 10*3/uL (ref 3.8–10.8)

## 2016-09-17 LAB — CYSTIC FIBROSIS DIAGNOSTIC STUDY

## 2016-09-19 ENCOUNTER — Inpatient Hospital Stay (HOSPITAL_COMMUNITY)
Admission: AD | Admit: 2016-09-19 | Discharge: 2016-09-19 | Disposition: A | Discharge status: Home / Self Care | Payer: Medicaid Other | Source: Ambulatory Visit | Attending: Obstetrics and Gynecology | Admitting: Obstetrics and Gynecology

## 2016-09-19 DIAGNOSIS — O26891 Other specified pregnancy related conditions, first trimester: Secondary | ICD-10-CM | POA: Diagnosis not present

## 2016-09-19 DIAGNOSIS — Z3A13 13 weeks gestation of pregnancy: Secondary | ICD-10-CM | POA: Insufficient documentation

## 2016-09-19 DIAGNOSIS — R42 Dizziness and giddiness: Secondary | ICD-10-CM

## 2016-09-19 DIAGNOSIS — E860 Dehydration: Secondary | ICD-10-CM

## 2016-09-19 LAB — URINALYSIS, ROUTINE W REFLEX MICROSCOPIC
Bilirubin Urine: NEGATIVE
GLUCOSE, UA: NEGATIVE mg/dL
HGB URINE DIPSTICK: NEGATIVE
Ketones, ur: NEGATIVE mg/dL
LEUKOCYTES UA: NEGATIVE
Nitrite: NEGATIVE
PROTEIN: NEGATIVE mg/dL
SPECIFIC GRAVITY, URINE: 1.026 (ref 1.005–1.030)
pH: 6 (ref 5.0–8.0)

## 2016-09-19 LAB — GLUCOSE, CAPILLARY: GLUCOSE-CAPILLARY: 89 mg/dL (ref 65–99)

## 2016-09-19 NOTE — MAU Note (Signed)
Pt comes in with c/o dizziness, and chills. States she called the office nurse and directed to  Come in to be seen.

## 2016-09-19 NOTE — Discharge Instructions (Signed)
Dehydration, Adult °Dehydration is when there is not enough fluid or water in your body. This happens when you lose more fluids than you take in. Dehydration can range from mild to very bad. It should be treated right away to keep it from getting very bad. °Symptoms of mild dehydration may include: °· Thirst. °· Dry lips. °· Slightly dry mouth. °· Dry, warm skin. °· Dizziness. °Symptoms of moderate dehydration may include: °· Very dry mouth. °· Muscle cramps. °· Dark pee (urine). Pee may be the color of tea. °· Your body making less pee. °· Your eyes making fewer tears. °· Heartbeat that is uneven or faster than normal (palpitations). °· Headache. °· Light-headedness, especially when you stand up from sitting. °· Fainting (syncope). °Symptoms of very bad dehydration may include: °· Changes in skin, such as: °¨ Cold and clammy skin. °¨ Blotchy (mottled) or pale skin. °¨ Skin that does not quickly return to normal after being lightly pinched and let go (poor skin turgor). °· Changes in body fluids, such as: °¨ Feeling very thirsty. °¨ Your eyes making fewer tears. °¨ Not sweating when body temperature is high, such as in hot weather. °¨ Your body making very little pee. °· Changes in vital signs, such as: °¨ Weak pulse. °¨ Pulse that is more than 100 beats a minute when you are sitting still. °¨ Fast breathing. °¨ Low blood pressure. °· Other changes, such as: °¨ Sunken eyes. °¨ Cold hands and feet. °¨ Confusion. °¨ Lack of energy (lethargy). °¨ Trouble waking up from sleep. °¨ Short-term weight loss. °¨ Unconsciousness. °Follow these instructions at home: °· If told by your doctor, drink an ORS: °¨ Make an ORS by using instructions on the package. °¨ Start by drinking small amounts, about ½ cup (120 mL) every 5-10 minutes. °¨ Slowly drink more until you have had the amount that your doctor said to have. °· Drink enough clear fluid to keep your pee clear or pale yellow. If you were told to drink an ORS, finish the ORS  first, then start slowly drinking clear fluids. Drink fluids such as: °¨ Water. Do not drink only water by itself. Doing that can make the salt (sodium) level in your body get too low (hyponatremia). °¨ Ice chips. °¨ Fruit juice that you have added water to (diluted). °¨ Low-calorie sports drinks. °· Avoid: °¨ Alcohol. °¨ Drinks that have a lot of sugar. These include high-calorie sports drinks, fruit juice that does not have water added, and soda. °¨ Caffeine. °¨ Foods that are greasy or have a lot of fat or sugar. °· Take over-the-counter and prescription medicines only as told by your doctor. °· Do not take salt tablets. Doing that can make the salt level in your body get too high (hypernatremia). °· Eat foods that have minerals (electrolytes). Examples include bananas, oranges, potatoes, tomatoes, and spinach. °· Keep all follow-up visits as told by your doctor. This is important. °Contact a doctor if: °· You have belly (abdominal) pain that: °¨ Gets worse. °¨ Stays in one area (localizes). °· You have a rash. °· You have a stiff neck. °· You get angry or annoyed more easily than normal (irritability). °· You are more sleepy than normal. °· You have a harder time waking up than normal. °· You feel: °¨ Weak. °¨ Dizzy. °¨ Very thirsty. °· You have peed (urinated) only a small amount of very dark pee during 6-8 hours. °Get help right away if: °· You have symptoms of   very bad dehydration.  You cannot drink fluids without throwing up (vomiting).  Your symptoms get worse with treatment.  You have a fever.  You have a very bad headache.  You are throwing up or having watery poop (diarrhea) and it:  Gets worse.  Does not go away.  You have blood or something green (bile) in your throw-up.  You have blood in your poop (stool). This may cause poop to look black and tarry.  You have not peed in 6-8 hours.  You pass out (faint).  Your heart rate when you are sitting still is more than 100 beats a  minute.  You have trouble breathing. This information is not intended to replace advice given to you by your health care provider. Make sure you discuss any questions you have with your health care provider. Document Released: 07/04/2009 Document Revised: 03/27/2016 Document Reviewed: 11/01/2015 Elsevier Interactive Patient Education  2017 [employer]  Eating Plan for Pregnant Women Introduction While you are pregnant, your body will require additional nutrition to help support your growing baby. It is recommended that you consume:  150 additional calories each day during your first trimester.  300 additional calories each day during your second trimester.  300 additional calories each day during your third trimester. Eating a healthy, well-balanced diet is very important for your health and for your baby's health. You also have a higher need for some vitamins and minerals, such as folic acid, calcium, iron, and vitamin D. What do I need to know about eating during pregnancy?  Do not try to lose weight or go on a diet during pregnancy.  Choose healthy, nutritious foods. Choose  of a sandwich with a glass of milk instead of a candy bar or a high-calorie sugar-sweetened beverage.  Limit your overall intake of foods that have "empty calories." These are foods that have little nutritional value, such as sweets, desserts, candies, sugar-sweetened beverages, and fried foods.  Eat a variety of foods, especially fruits and vegetables.  Take a prenatal vitamin to help meet the additional needs during pregnancy, specifically for folic acid, iron, calcium, and vitamin D.  Remember to stay active. Ask your health care provider for exercise recommendations that are specific to you.  Practice good food safety and cleanliness, such as washing your hands before you eat and after you prepare raw meat. This helps to prevent foodborne illnesses, such as listeriosis, that can be very dangerous for  your baby. Ask your health care provider for more information about listeriosis. What does 150 extra calories look like? Healthy options for an additional 150 calories each day could be any of the following:  Plain low-fat yogurt (6-8 oz) with  cup of berries.  1 apple with 2 teaspoons of peanut butter.  Cut-up vegetables with  cup of hummus.  Low-fat chocolate milk (8 oz or 1 cup).  1 string cheese with 1 medium orange.   of a peanut butter and jelly sandwich on whole-wheat bread (1 tsp of peanut butter). For 300 calories, you could eat two of those healthy options each day. What is a healthy amount of weight to gain? The recommended amount of weight for you to gain is based on your pre-pregnancy BMI. If your pre-pregnancy BMI was:  Less than 18 (underweight), you should gain 28-40 lb.  18-24.9 (normal), you should gain 25-35 lb.  25-29.9 (overweight), you should gain 15-25 lb.  Greater than 30 (obese), you should gain 11-20 lb. What if I am having twins  or multiples? Generally, pregnant women who will be having twins or multiples may need to increase their daily calories by 300-600 calories each day. The recommended range for total weight gain is 25-54 lb, depending on your pre-pregnancy BMI. Talk with your health care provider for specific guidance about additional nutritional needs, weight gain, and exercise during your pregnancy. What foods can I eat? Grains  Any grains. Try to choose whole grains, such as whole-wheat bread, oatmeal, or brown rice. Vegetables  Any vegetables. Try to eat a variety of colors and types of vegetables to get a full range of vitamins and minerals. Remember to wash your vegetables well before eating. Fruits  Any fruits. Try to eat a variety of colors and types of fruit to get a full range of vitamins and minerals. Remember to wash your fruits well before eating. Meats and Other Protein Sources  Lean meats, including chicken, [place], fish, and  lean cuts of beef, veal, or pork. Make sure that all meats are cooked to "well done." Tofu. Tempeh. Beans. Eggs. Peanut butter and other nut butters. Seafood, such as shrimp, crab, and lobster. If you choose fish, select types that are higher in omega-3 fatty acids, including salmon, herring, mussels, trout, sardines, and pollock. Make sure that all meats are cooked to food-safe temperatures. Dairy  Pasteurized milk and milk alternatives. Pasteurized yogurt and pasteurized cheese. Cottage cheese. Sour cream. Beverages  Water. Juices that contain 100% fruit juice or vegetable juice. Caffeine-free teas and decaffeinated coffee. Drinks that contain caffeine are okay to drink, but it is better to avoid caffeine. Keep your total caffeine intake to less than 200 mg each day (12 oz of coffee, tea, or soda) or as directed by your health care provider. Condiments  Any pasteurized condiments. Sweets and Desserts  Any sweets and desserts. Fats and Oils  Any fats and oils. The items listed above may not be a complete list of recommended foods or beverages. Contact your dietitian for more options.  What foods are not recommended? Vegetables  Unpasteurized (raw) vegetable juices. Fruits  Unpasteurized (raw) fruit juices. Meats and Other Protein Sources  Cured meats that have nitrates, such as bacon, salami, and hotdogs. Luncheon meats, bologna, or other deli meats (unless they are reheated until they are steaming hot). Refrigerated pate, meat spreads from a meat counter, smoked seafood that is found in the refrigerated section of a store. Raw fish, such as sushi or sashimi. High mercury content fish, such as tilefish, shark, swordfish, and king mackerel. Raw meats, such as tuna or beef tartare. Undercooked meats and poultry. Make sure that all meats are cooked to food-safe temperatures. Dairy  Unpasteurized (raw) milk and any foods that have raw milk in them. Soft cheeses, such as feta, queso blanco, queso  fresco, Brie, Camembert cheeses, blue-veined cheeses, and Panela cheese (unless it is made with pasteurized milk, which must be stated on the label). Beverages  Alcohol. Sugar-sweetened beverages, such as sodas, teas, or energy drinks. Condiments  Homemade fermented foods and drinks, such as pickles, sauerkraut, or kombucha drinks. (Store-bought pasteurized versions of these are okay.) Other  Salads that are made in the store, such as ham salad, chicken salad, egg salad, tuna salad, and seafood salad. The items listed above may not be a complete list of foods and beverages to avoid. Contact your dietitian for more information.  This information is not intended to replace advice given to you by your health care provider. Make sure you discuss any questions you  have with your health care provider. Document Released: 06/22/2014 Document Revised: 02/13/2016 Document Reviewed: 02/20/2014  2017 Elsevier  First Trimester of Pregnancy The first trimester of pregnancy is from week 1 until the end of week 12 (months 1 through 3). A week after a sperm fertilizes an egg, the egg will implant on the wall of the uterus. This embryo will begin to develop into a baby. Genes from you and your partner are forming the baby. The female genes determine whether the baby is a boy or a girl. At 6-8 weeks, the eyes and face are formed, and the heartbeat can be seen on ultrasound. At the end of 12 weeks, all the baby's organs are formed.  Now that you are pregnant, you will want to do everything you can to have a healthy baby. Two of the most important things are to get good prenatal care and to follow your health care provider's instructions. Prenatal care is all the medical care you receive before the baby's birth. This care will help prevent, find, and treat any problems during the pregnancy and childbirth. BODY CHANGES Your body goes through many changes during pregnancy. The changes vary from woman to woman.   You may  gain or lose a couple of pounds at first.  You may feel sick to your stomach (nauseous) and throw up (vomit). If the vomiting is uncontrollable, call your health care provider.  You may tire easily.  You may develop headaches that can be relieved by medicines approved by your health care provider.  You may urinate more often. Painful urination may mean you have a bladder infection.  You may develop heartburn as a result of your pregnancy.  You may develop constipation because certain hormones are causing the muscles that push waste through your intestines to slow down.  You may develop hemorrhoids or swollen, bulging veins (varicose veins).  Your breasts may begin to grow larger and become tender. Your nipples may stick out more, and the tissue that surrounds them (areola) may become darker.  Your gums may bleed and may be sensitive to brushing and flossing.  Dark spots or blotches (chloasma, mask of pregnancy) may develop on your face. This will likely fade after the baby is born.  Your menstrual periods will stop.  You may have a loss of appetite.  You may develop cravings for certain kinds of food.  You may have changes in your emotions from day to day, such as being excited to be pregnant or being concerned that something may go wrong with the pregnancy and baby.  You may have more vivid and strange dreams.  You may have changes in your hair. These can include thickening of your hair, rapid growth, and changes in texture. Some women also have hair loss during or after pregnancy, or hair that feels dry or thin. Your hair will most likely return to normal after your baby is born. WHAT TO EXPECT AT YOUR PRENATAL VISITS During a routine prenatal visit:  You will be weighed to make sure you and the baby are growing normally.  Your blood pressure will be taken.  Your abdomen will be measured to track your baby's growth.  The fetal heartbeat will be listened to starting around  week 10 or 12 of your pregnancy.  Test results from any previous visits will be discussed. Your health care provider may ask you:  How you are feeling.  If you are feeling the baby move.  If you have had any  abnormal symptoms, such as leaking fluid, bleeding, severe headaches, or abdominal cramping.  If you are using any tobacco products, including cigarettes, chewing tobacco, and electronic cigarettes.  If you have any questions. Other tests that may be performed during your first trimester include:  Blood tests to find your blood type and to check for the presence of any previous infections. They will also be used to check for low iron levels (anemia) and Rh antibodies. Later in the pregnancy, blood tests for diabetes will be done along with other tests if problems develop.  Urine tests to check for infections, diabetes, or protein in the urine.  An ultrasound to confirm the proper growth and development of the baby.  An amniocentesis to check for possible genetic problems.  Fetal screens for spina bifida and Down syndrome.  You may need other tests to make sure you and the baby are doing well.  HIV (human immunodeficiency virus) testing. Routine prenatal testing includes screening for HIV, unless you choose not to have this test. HOME CARE INSTRUCTIONS  Medicines   Follow your health care provider's instructions regarding medicine use. Specific medicines may be either safe or unsafe to take during pregnancy.  Take your prenatal vitamins as directed.  If you develop constipation, try taking a stool softener if your health care provider approves. Diet   Eat regular, well-balanced meals. Choose a variety of foods, such as meat or vegetable-based protein, fish, milk and low-fat dairy products, vegetables, fruits, and whole grain breads and cereals. Your health care provider will help you determine the amount of weight gain that is right for you.  Avoid raw meat and uncooked  cheese. These carry germs that can cause birth defects in the baby.  Eating four or five small meals rather than three large meals a day may help relieve nausea and vomiting. If you start to feel nauseous, eating a few soda crackers can be helpful. Drinking liquids between meals instead of during meals also seems to help nausea and vomiting.  If you develop constipation, eat more high-fiber foods, such as fresh vegetables or fruit and whole grains. Drink enough fluids to keep your urine clear or pale yellow. Activity and Exercise   Exercise only as directed by your health care provider. Exercising will help you:  Control your weight.  Stay in shape.  Be prepared for labor and delivery.  Experiencing pain or cramping in the lower abdomen or low back is a good sign that you should stop exercising. Check with your health care provider before continuing normal exercises.  Try to avoid standing for long periods of time. Move your legs often if you must stand in one place for a long time.  Avoid heavy lifting.  Wear low-heeled shoes, and practice good posture.  You may continue to have sex unless your health care provider directs you otherwise. Relief of Pain or Discomfort   Wear a good support bra for breast tenderness.   Take warm sitz baths to soothe any pain or discomfort caused by hemorrhoids. Use hemorrhoid cream if your health care provider approves.   Rest with your legs elevated if you have leg cramps or low back pain.  If you develop varicose veins in your legs, wear support hose. Elevate your feet for 15 minutes, 3-4 times a day. Limit salt in your diet. Prenatal Care   Schedule your prenatal visits by the twelfth week of pregnancy. They are usually scheduled monthly at first, then more often in the last 2  months before delivery.  Write down your questions. Take them to your prenatal visits.  Keep all your prenatal visits as directed by your health care provider. Safety    Wear your seat belt at all times when driving.  Make a list of emergency phone numbers, including numbers for family, friends, the hospital, and police and fire departments. General Tips   Ask your health care provider for a referral to a local prenatal education class. Begin classes no later than at the beginning of month 6 of your pregnancy.  Ask for help if you have counseling or nutritional needs during pregnancy. Your health care provider can offer advice or refer you to specialists for help with various needs.  Do not use hot tubs, steam rooms, or saunas.  Do not douche or use tampons or scented sanitary pads.  Do not cross your legs for long periods of time.  Avoid cat litter boxes and soil used by cats. These carry germs that can cause birth defects in the baby and possibly loss of the fetus by miscarriage or stillbirth.  Avoid all smoking, herbs, alcohol, and medicines not prescribed by your health care provider. Chemicals in these affect the formation and growth of the baby.  Do not use any tobacco products, including cigarettes, chewing tobacco, and electronic cigarettes. If you need help quitting, ask your health care provider. You may receive counseling support and other resources to help you quit.  Schedule a dentist appointment. At home, brush your teeth with a soft toothbrush and be gentle when you floss. SEEK MEDICAL CARE IF:   You have dizziness.  You have mild pelvic cramps, pelvic pressure, or nagging pain in the abdominal area.  You have persistent nausea, vomiting, or diarrhea.  You have a bad smelling vaginal discharge.  You have pain with urination.  You notice increased swelling in your face, hands, legs, or ankles. SEEK IMMEDIATE MEDICAL CARE IF:   You have a fever.  You are leaking fluid from your vagina.  You have spotting or bleeding from your vagina.  You have severe abdominal cramping or pain.  You have rapid weight gain or loss.  You  vomit blood or material that looks like coffee grounds.  You are exposed to [place] measles and have never had them.  You are exposed to fifth disease or chickenpox.  You develop a severe headache.  You have shortness of breath.  You have any kind of trauma, such as from a fall or a car accident. This information is not intended to replace advice given to you by your health care provider. Make sure you discuss any questions you have with your health care provider. Document Released: 09/01/2001 Document Revised: 09/28/2014 Document Reviewed: 07/18/2013 Elsevier Interactive Patient Education  2017 [employer]

## 2016-09-19 NOTE — [facility] Provider Note (Signed)
History     CSN: [id number]  Arrival date and time: 09/19/16 [id number]   First Provider Initiated Contact with Patient 09/19/16 1853      Chief Complaint  Patient presents with  . Dizziness  . Chills   [id number] @13 .[redacted] weeks gestation here with episode of dizziness. She reports episode happened around 1630 today. She did not have LOC. She lied down and started feeling better after about 45 minutes. She reports a similar episode last week. She reports good po water intake today. She reports eating well, last meal at noon (somewhat heavy in carbs). She denies VB or abdominal pain.    OB History    Gravida Para Term Preterm AB Living   3 1 1  0 1 1   SAB TAB Ectopic Multiple Live Births   1 0 0 0 1      Past Medical History:  Diagnosis Date  . Anemia   . Anxiety    no meds  . Blood transfusion without reported diagnosis 2014   x3  . Hemoglobin C trait (HCC)   . History of blood transfusion 01/17/2014   X 3 09/2012   . Infection    UTI  . Urinary tract infection     Past Surgical History:  Procedure Laterality Date  . NO PAST SURGERIES      Family History  Problem Relation Age of Onset  . Thyroid disease Mother   . Diabetes Maternal Grandmother   . Cancer Maternal Grandmother     cervical  . Cancer Maternal Grandfather   . Hearing loss Neg Hx     Social History  Substance Use Topics  . Smoking status: Never Smoker  . Smokeless tobacco: Never Used  . Alcohol use No    Allergies:  Allergies  Allergen Reactions  . Diflucan [Fluconazole] Hives    Prescriptions Prior to Admission  Medication Sig Dispense Refill Last Dose  . Prenatal Vit-Fe Fumarate-FA (PRENATAL MULTIVITAMIN) TABS tablet Take 1 tablet by mouth daily at 12 noon. 30 tablet 12 Taking    Review of Systems  Constitutional: Negative.   Gastrointestinal: Negative.   Neurological: Positive for dizziness.   Physical Exam   Blood pressure 112/58, pulse 77, temperature 97.2 F (36.2 C), resp. rate  18, height 5\' 6"  (1.676 m), last menstrual period 06/14/2016, SpO2 100 %, currently breastfeeding.  Physical Exam  Constitutional: She is oriented to person, place, and time. She appears well-developed and well-nourished. No distress.  HENT:  Head: Normocephalic and atraumatic.  Neck: Normal range of motion.  Cardiovascular: Normal rate and regular rhythm.   Respiratory: Effort normal and breath sounds normal.  GI: Soft. She exhibits no distension. There is no tenderness.  Musculoskeletal: Normal range of motion.  Neurological: She is alert and oriented to person, place, and time.  Skin: Skin is warm and dry.  Psychiatric: She has a normal mood and affect.  FHT: 167 bpm  Results for orders placed or performed during the hospital encounter of 09/19/16 (from the past 24 hour(s))  Urinalysis, Routine w reflex microscopic     Status: Abnormal   Collection Time: 09/19/16  6:36 PM  Result Value Ref Range   Color, Urine YELLOW YELLOW   APPearance HAZY (A) CLEAR   Specific Gravity, Urine 1.026 1.005 - 1.030   pH 6.0 5.0 - 8.0   Glucose, UA NEGATIVE NEGATIVE mg/dL   Hgb urine dipstick NEGATIVE NEGATIVE   Bilirubin Urine NEGATIVE NEGATIVE   Ketones, ur NEGATIVE NEGATIVE mg/dL  Protein, ur NEGATIVE NEGATIVE mg/dL   Nitrite NEGATIVE NEGATIVE   Leukocytes, UA NEGATIVE NEGATIVE  Glucose, capillary     Status: None   Collection Time: 09/19/16  7:02 PM  Result Value Ref Range   Glucose-Capillary 89 65 - 99 mg/dL   MAU Course  Procedures  MDM Labs ordered and reviewed. Symptoms likely caused from dehydration (high urine SG) and/or blood glucose shift d/t carb heavy meal. Vitals stable, not orthostatic. Recent CBC nml. Stable for discharge home.   Assessment and Plan   1. [redacted] weeks gestation of pregnancy   2. Dizziness   3. Dehydration    Discharge home Increase water intake Eat frequent meals and snacks, include protein with carbs Follow up in office as scheduled Notify provider if  sx recurr  Allergies as of 09/19/2016      Reactions   Diflucan [fluconazole] Hives      Medication List    TAKE these medications   prenatal multivitamin Tabs tablet Take 1 tablet by mouth daily at 12 noon.      Donette LarryMelanie Chantil Bari, CNM 09/19/2016, 6:57 PM

## 2016-09-21 NOTE — L&D Delivery Note (Signed)
20 y.o. G3P1011 at 2949w4d delivered a viable female infant in cephalic, LOA position. Anterior shoulder delivered with ease. 60 sec delayed cord clamping. Cord clamped x2 and cut. Placenta delivered spontaneously intact, with 3VC. Fundus firm on exam with massage and pitocin. Good hemostasis noted.  Anesthesia: Epidural Laceration: minor labial lacerations approximate well and do not require sutures Good hemostasis noted. EBL: 150 cc  Mom and baby recovering in LDR.    Apgars: APGAR (1 MIN):  9 APGAR (5 MINS):  9 Weight: Pending skin to skin  Sponge and instrument count were correct x2. Placenta sent to L&D.  Howard PouchLauren Feng, MD PGY-1 Family Medicine 03/11/2017, 9:02 AM   I certify that I was present and gloved for this delivery in its entirety, and I agree with the above documentation and findings.  Luna KitchensKathryn Ravindra Baranek CNM

## 2016-10-14 ENCOUNTER — Telehealth: Payer: Self-pay

## 2016-10-14 NOTE — Telephone Encounter (Signed)
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 A student's (in her class)sister has the rash currently. Patient worried about what to do.  Will discuss with provide and call patient back. Armandina StammerJennifer Karington Zarazua

## 2016-10-14 NOTE — Telephone Encounter (Signed)
Patient made aware I discussed her situation with Dr. Adrian BlackwaterStinson. Patient made aware that we could draw blood tosee if she is immune to the virus that causes fifths disease. 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 Patient states she also has anatomy ultraound in two weeks and I agreed that this would give us a lot more information.  Reviewed proper handwashing techniques and suggested she change clothes every day after working to help reduce chance of getting anything viral/flu/etc. Armandina StammerJennifer Levent Kornegay RN BSN

## 2016-10-28 ENCOUNTER — Ambulatory Visit (HOSPITAL_COMMUNITY)
Admission: RE | Admit: 2016-10-28 | Discharge: 2016-10-28 | Disposition: A | Discharge status: Home / Self Care | Payer: Medicaid Other | Source: Ambulatory Visit | Attending: Obstetrics and Gynecology | Admitting: Obstetrics and Gynecology

## 2016-10-28 DIAGNOSIS — Z3A19 19 weeks gestation of pregnancy: Secondary | ICD-10-CM | POA: Insufficient documentation

## 2016-10-28 DIAGNOSIS — Z363 Encounter for antenatal screening for malformations: Secondary | ICD-10-CM | POA: Insufficient documentation

## 2016-10-28 DIAGNOSIS — Z3492 Encounter for supervision of normal pregnancy, unspecified, second trimester: Secondary | ICD-10-CM | POA: Insufficient documentation

## 2016-10-28 DIAGNOSIS — Z3490 Encounter for supervision of normal pregnancy, unspecified, unspecified trimester: Secondary | ICD-10-CM

## 2016-11-02 ENCOUNTER — Ambulatory Visit (INDEPENDENT_AMBULATORY_CARE_PROVIDER_SITE_OTHER): Payer: Medicaid Other | Admitting: Family Medicine

## 2016-11-02 VITALS — BP 118/71 | HR 89 | Wt 167.0 lb

## 2016-11-02 DIAGNOSIS — M9903 Segmental and somatic dysfunction of lumbar region: Secondary | ICD-10-CM

## 2016-11-02 DIAGNOSIS — M9904 Segmental and somatic dysfunction of sacral region: Secondary | ICD-10-CM

## 2016-11-02 DIAGNOSIS — M549 Dorsalgia, unspecified: Secondary | ICD-10-CM

## 2016-11-02 DIAGNOSIS — O99891 Other specified diseases and conditions complicating pregnancy: Secondary | ICD-10-CM

## 2016-11-02 DIAGNOSIS — M9905 Segmental and somatic dysfunction of pelvic region: Secondary | ICD-10-CM

## 2016-11-02 DIAGNOSIS — O9989 Other specified diseases and conditions complicating pregnancy, childbirth and the puerperium: Secondary | ICD-10-CM

## 2016-11-02 DIAGNOSIS — O26892 Other specified pregnancy related conditions, second trimester: Secondary | ICD-10-CM

## 2016-11-02 DIAGNOSIS — Z3490 Encounter for supervision of normal pregnancy, unspecified, unspecified trimester: Secondary | ICD-10-CM

## 2016-11-02 NOTE — Progress Notes (Signed)
   PRENATAL VISIT NOTE  Subjective:  Vicki Mcdonald is a 21 y.o. G3P1011 at 7339w1d being seen today for ongoing prenatal care.  She is currently monitored for the following issues for this low-risk pregnancy and has Hemoglobin C trait (HCC); Rh negative state in antepartum period; and Supervision of normal pregnancy on her problem list.  Patient reports right lower back pain with radiation down to hamstring. Worse with transition from laying down to sitting up..  Contractions: Not present. Vag. Bleeding: None.  Movement: Present. Denies leaking of fluid.   The following portions of the patient's history were reviewed and updated as appropriate: allergies, current medications, past family history, past medical history, past social history, past surgical history and problem list. Problem list updated.  Objective:   Vitals:   11/02/16 0947  BP: 118/71  Pulse: 89  Weight: 167 lb (75.8 kg)    Fetal Status: Fetal Heart Rate (bpm): 150   Movement: Present     General:  Alert, oriented and cooperative. Patient is in no acute distress.  Skin: Skin is warm and dry. No rash noted.   Cardiovascular: Normal heart rate noted  Respiratory: Normal respiratory effort, no problems with respiration noted  Abdomen: Soft, gravid, appropriate for gestational age. Pain/Pressure: Absent     Pelvic:  Cervical exam deferred        MSK: Restriction, tenderness, tissue texture changes, and paraspinal spasm in the right lumbar spine  Neuro: Moves all four extremities with no focal neurological deficit  Extremities: Normal range of motion.  Edema: None  Mental Status: Normal mood and affect. Normal behavior. Normal judgment and thought content.   OSE: Head   Cervical   Thoracic   Rib   Lumbar  L3 ESRR, L5 ESRL  Sacrum  L/L  Pelvis  Right ant innom    Assessment and Plan:  Pregnancy: G3P1011 at 6339w1d  1. Encounter for supervision of normal pregnancy, antepartum, unspecified gravidity FHT and Fh  normal - US MFM OB FOLLOW UP; Future  2. Back pain affecting pregnancy in second trimester 3. Somatic dysfunction of lumbar region 4. Somatic dysfunction of sacral region 5. Somatic dysfunction of pelvis region Received verbal permission to preform OMT.  Pt treated with HVLA with good subjective relief and objective improvement of motion.   Preterm labor symptoms and general obstetric precautions including but not limited to vaginal bleeding, contractions, leaking of fluid and fetal movement were reviewed in detail with the patient. Please refer to After Visit Summary for other counseling recommendations.  Return in about 4 weeks (around 11/30/2016) for OB f/u.  Levie HeritageJacob J Stinson, DO

## 2016-11-25 ENCOUNTER — Other Ambulatory Visit: Payer: Self-pay | Admitting: Family Medicine

## 2016-11-25 ENCOUNTER — Ambulatory Visit (HOSPITAL_COMMUNITY)
Admission: RE | Admit: 2016-11-25 | Discharge: 2016-11-25 | Disposition: A | Discharge status: Home / Self Care | Payer: Medicaid Other | Source: Ambulatory Visit | Attending: Family Medicine | Admitting: Family Medicine

## 2016-11-25 DIAGNOSIS — Z0489 Encounter for examination and observation for other specified reasons: Secondary | ICD-10-CM

## 2016-11-25 DIAGNOSIS — Z3A23 23 weeks gestation of pregnancy: Secondary | ICD-10-CM

## 2016-11-25 DIAGNOSIS — Z362 Encounter for other antenatal screening follow-up: Secondary | ICD-10-CM

## 2016-11-25 DIAGNOSIS — Z3490 Encounter for supervision of normal pregnancy, unspecified, unspecified trimester: Secondary | ICD-10-CM

## 2016-11-25 DIAGNOSIS — IMO0002 Reserved for concepts with insufficient information to code with codable children: Secondary | ICD-10-CM

## 2016-11-30 ENCOUNTER — Ambulatory Visit (INDEPENDENT_AMBULATORY_CARE_PROVIDER_SITE_OTHER): Payer: Medicaid Other | Admitting: Family Medicine

## 2016-11-30 VITALS — BP 114/64 | HR 85 | Wt 172.0 lb

## 2016-11-30 DIAGNOSIS — O26899 Other specified pregnancy related conditions, unspecified trimester: Secondary | ICD-10-CM

## 2016-11-30 DIAGNOSIS — Z3490 Encounter for supervision of normal pregnancy, unspecified, unspecified trimester: Secondary | ICD-10-CM

## 2016-11-30 DIAGNOSIS — Z6791 Unspecified blood type, Rh negative: Secondary | ICD-10-CM

## 2016-11-30 DIAGNOSIS — Z3482 Encounter for supervision of other normal pregnancy, second trimester: Secondary | ICD-10-CM

## 2016-11-30 DIAGNOSIS — Z3492 Encounter for supervision of normal pregnancy, unspecified, second trimester: Secondary | ICD-10-CM

## 2016-11-30 NOTE — Patient Instructions (Signed)
 Second Trimester of Pregnancy The second trimester is from week 14 through week 27 (months 4 through 6). The second trimester is often a time when you feel your best. Your body has adjusted to being pregnant, and you begin to feel better physically. Usually, morning sickness has lessened or quit completely, you may have more energy, and you may have an increase in appetite. The second trimester is also a time when the fetus is growing rapidly. At the end of the sixth month, the fetus is about 9 inches long and weighs about 1 pounds. You will likely begin to feel the baby move (quickening) between 16 and 20 weeks of pregnancy. Body changes during your second trimester Your body continues to go through many changes during your second trimester. The changes vary from woman to woman.  Your weight will continue to increase. You will notice your lower abdomen bulging out.  You may begin to get stretch marks on your hips, abdomen, and breasts.  You may develop headaches that can be relieved by medicines. The medicines should be approved by your health care provider.  You may urinate more often because the fetus is pressing on your bladder.  You may develop or continue to have heartburn as a result of your pregnancy.  You may develop constipation because certain hormones are causing the muscles that push waste through your intestines to slow down.  You may develop hemorrhoids or swollen, bulging veins (varicose veins).  You may have back pain. This is caused by: ? Weight gain. ? Pregnancy hormones that are relaxing the joints in your pelvis. ? A shift in weight and the muscles that support your balance.  Your breasts will continue to grow and they will continue to become tender.  Your gums may bleed and may be sensitive to brushing and flossing.  Dark spots or blotches (chloasma, mask of pregnancy) may develop on your face. This will likely fade after the baby is born.  A dark line from  your belly button to the pubic area (linea nigra) may appear. This will likely fade after the baby is born.  You may have changes in your hair. These can include thickening of your hair, rapid growth, and changes in texture. Some women also have hair loss during or after pregnancy, or hair that feels dry or thin. Your hair will most likely return to normal after your baby is born.  What to expect at prenatal visits During a routine prenatal visit:  You will be weighed to make sure you and the fetus are growing normally.  Your blood pressure will be taken.  Your abdomen will be measured to track your baby's growth.  The fetal heartbeat will be listened to.  Any test results from the previous visit will be discussed.  Your health care provider may ask you:  How you are feeling.  If you are feeling the baby move.  If you have had any abnormal symptoms, such as leaking fluid, bleeding, severe headaches, or abdominal cramping.  If you are using any tobacco products, including cigarettes, chewing tobacco, and electronic cigarettes.  If you have any questions.  Other tests that may be performed during your second trimester include:  Blood tests that check for: ? Low iron levels (anemia). ? High blood sugar that affects pregnant women (gestational diabetes) between 24 and 28 weeks. ? Rh antibodies. This is to check for a protein on red blood cells (Rh factor).  Urine tests to check for infections, diabetes,   or protein in the urine.  An ultrasound to confirm the proper growth and development of the baby.  An amniocentesis to check for possible genetic problems.  Fetal screens for spina bifida and Down syndrome.  HIV (human immunodeficiency virus) testing. Routine prenatal testing includes screening for HIV, unless you choose not to have this test.  Follow these instructions at home: Medicines  Follow your health care provider's instructions regarding medicine use. Specific  medicines may be either safe or unsafe to take during pregnancy.  Take a prenatal vitamin that contains at least 600 micrograms (mcg) of folic acid.  If you develop constipation, try taking a stool softener if your health care provider approves. Eating and drinking  Eat a balanced diet that includes fresh fruits and vegetables, whole grains, good sources of protein such as meat, eggs, or tofu, and low-fat dairy. Your health care provider will help you determine the amount of weight gain that is right for you.  Avoid raw meat and uncooked cheese. These carry germs that can cause birth defects in the baby.  If you have low calcium intake from food, talk to your health care provider about whether you should take a daily calcium supplement.  Limit foods that are high in fat and processed sugars, such as fried and sweet foods.  To prevent constipation: ? Drink enough fluid to keep your urine clear or pale yellow. ? Eat foods that are high in fiber, such as fresh fruits and vegetables, whole grains, and beans. Activity  Exercise only as directed by your health care provider. Most women can continue their usual exercise routine during pregnancy. Try to exercise for 30 minutes at least 5 days a week. Stop exercising if you experience uterine contractions.  Avoid heavy lifting, wear low heel shoes, and practice good posture.  A sexual relationship may be continued unless your health care provider directs you otherwise. Relieving pain and discomfort  Wear a good support bra to prevent discomfort from breast tenderness.  Take warm sitz baths to soothe any pain or discomfort caused by hemorrhoids. Use hemorrhoid cream if your health care provider approves.  Rest with your legs elevated if you have leg cramps or low back pain.  If you develop varicose veins, wear support hose. Elevate your feet for 15 minutes, 3-4 times a day. Limit salt in your diet. Prenatal Care  Write down your questions.  Take them to your prenatal visits.  Keep all your prenatal visits as told by your health care provider. This is important. Safety  Wear your seat belt at all times when driving.  Make a list of emergency phone numbers, including numbers for family, friends, the hospital, and police and fire departments. General instructions  Ask your health care provider for a referral to a local prenatal education class. Begin classes no later than the beginning of month 6 of your pregnancy.  Ask for help if you have counseling or nutritional needs during pregnancy. Your health care provider can offer advice or refer you to specialists for help with various needs.  Do not use hot tubs, steam rooms, or saunas.  Do not douche or use tampons or scented sanitary pads.  Do not cross your legs for long periods of time.  Avoid cat litter boxes and soil used by cats. These carry germs that can cause birth defects in the baby and possibly loss of the fetus by miscarriage or stillbirth.  Avoid all smoking, herbs, alcohol, and unprescribed drugs. Chemicals in these products   can affect the formation and growth of the baby.  Do not use any products that contain nicotine or tobacco, such as cigarettes and e-cigarettes. If you need help quitting, ask your health care provider.  Visit your dentist if you have not gone yet during your pregnancy. Use a soft toothbrush to brush your teeth and be gentle when you floss. Contact a health care provider if:  You have dizziness.  You have mild pelvic cramps, pelvic pressure, or nagging pain in the abdominal area.  You have persistent nausea, vomiting, or diarrhea.  You have a bad smelling vaginal discharge.  You have pain when you urinate. Get help right away if:  You have a fever.  You are leaking fluid from your vagina.  You have spotting or bleeding from your vagina.  You have severe abdominal cramping or pain.  You have rapid weight gain or weight  loss.  You have shortness of breath with chest pain.  You notice sudden or extreme swelling of your face, hands, ankles, feet, or legs.  You have not felt your baby move in over an hour.  You have severe headaches that do not go away when you take medicine.  You have vision changes. Summary  The second trimester is from week 14 through week 27 (months 4 through 6). It is also a time when the fetus is growing rapidly.  Your body goes through many changes during pregnancy. The changes vary from woman to woman.  Avoid all smoking, herbs, alcohol, and unprescribed drugs. These chemicals affect the formation and growth your baby.  Do not use any tobacco products, such as cigarettes, chewing tobacco, and e-cigarettes. If you need help quitting, ask your health care provider.  Contact your health care provider if you have any questions. Keep all prenatal visits as told by your health care provider. This is important. This information is not intended to replace advice given to you by your health care provider. Make sure you discuss any questions you have with your health care provider. Document Released: 09/01/2001 Document Revised: 02/13/2016 Document Reviewed: 11/08/2012 [employer] Interactive Patient Education  2017 [employer]   Breastfeeding Deciding to breastfeed is one of the best choices you can make for you and your baby. A change in hormones during pregnancy causes your breast tissue to grow and increases the number and size of your milk ducts. These hormones also allow proteins, sugars, and fats from your blood supply to make breast milk in your milk-producing glands. Hormones prevent breast milk from being released before your baby is born as well as prompt milk flow after birth. Once breastfeeding has begun, thoughts of your baby, as well as his or her sucking or crying, can stimulate the release of milk from your milk-producing glands. Benefits of breastfeeding For Your  Baby  Your first milk (colostrum) helps your baby's digestive system function better.  There are antibodies in your milk that help your baby fight off infections.  Your baby has a lower incidence of asthma, allergies, and sudden infant death syndrome.  The nutrients in breast milk are better for your baby than infant formulas and are designed uniquely for your baby's needs.  Breast milk improves your baby's brain development.  Your baby is less likely to develop other conditions, such as childhood obesity, asthma, or type 2 diabetes mellitus.  For You  Breastfeeding helps to create a very special bond between you and your baby.  Breastfeeding is convenient. Breast milk is always available at   the correct temperature and costs nothing.  Breastfeeding helps to burn calories and helps you lose the weight gained during pregnancy.  Breastfeeding makes your uterus contract to its prepregnancy size faster and slows bleeding (lochia) after you give birth.  Breastfeeding helps to lower your risk of developing type 2 diabetes mellitus, osteoporosis, and breast or ovarian cancer later in life.  Signs that your baby is hungry Early Signs of Hunger  Increased alertness or activity.  Stretching.  Movement of the head from side to side.  Movement of the head and opening of the mouth when the corner of the mouth or cheek is stroked (rooting).  Increased sucking sounds, smacking lips, cooing, sighing, or squeaking.  Hand-to-mouth movements.  Increased sucking of fingers or hands.  Late Signs of Hunger  Fussing.  Intermittent crying.  Extreme Signs of Hunger Signs of extreme hunger will require calming and consoling before your baby will be able to breastfeed successfully. Do not wait for the following signs of extreme hunger to occur before you initiate breastfeeding:  Restlessness.  A loud, strong cry.  Screaming.  Breastfeeding basics Breastfeeding Initiation  Find a  comfortable place to sit or lie down, with your neck and back well supported.  Place a pillow or rolled up blanket under your baby to bring him or her to the level of your breast (if you are seated). Nursing pillows are specially designed to help support your arms and your baby while you breastfeed.  Make sure that your baby's abdomen is facing your abdomen.  Gently massage your breast. With your fingertips, massage from your chest wall toward your nipple in a circular motion. This encourages milk flow. You may need to continue this action during the feeding if your milk flows slowly.  Support your breast with 4 fingers underneath and your thumb above your nipple. Make sure your fingers are well away from your nipple and your baby's mouth.  Stroke your baby's lips gently with your finger or nipple.  When your baby's mouth is open wide enough, quickly bring your baby to your breast, placing your entire nipple and as much of the colored area around your nipple (areola) as possible into your baby's mouth. ? More areola should be visible above your baby's upper lip than below the lower lip. ? Your baby's tongue should be between his or her lower gum and your breast.  Ensure that your baby's mouth is correctly positioned around your nipple (latched). Your baby's lips should create a seal on your breast and be turned out (everted).  It is common for your baby to suck about 2-3 minutes in order to start the flow of breast milk.  Latching Teaching your baby how to latch on to your breast properly is very important. An improper latch can cause nipple pain and decreased milk supply for you and poor weight gain in your baby. Also, if your baby is not latched onto your nipple properly, he or she may swallow some air during feeding. This can make your baby fussy. Burping your baby when you switch breasts during the feeding can help to get rid of the air. However, teaching your baby to latch on properly is  still the best way to prevent fussiness from swallowing air while breastfeeding. Signs that your baby has successfully latched on to your nipple:  Silent tugging or silent sucking, without causing you pain.  Swallowing heard between every 3-4 sucks.  Muscle movement above and in front of his or her   ears while sucking.  Signs that your baby has not successfully latched on to nipple:  Sucking sounds or smacking sounds from your baby while breastfeeding.  Nipple pain.  If you think your baby has not latched on correctly, slip your finger into the corner of your baby's mouth to break the suction and place it between your baby's gums. Attempt breastfeeding initiation again. Signs of Successful Breastfeeding Signs from your baby:  A gradual decrease in the number of sucks or complete cessation of sucking.  Falling asleep.  Relaxation of his or her body.  Retention of a small amount of milk in his or her mouth.  Letting go of your breast by himself or herself.  Signs from you:  Breasts that have increased in firmness, weight, and size 1-3 hours after feeding.  Breasts that are softer immediately after breastfeeding.  Increased milk volume, as well as a change in milk consistency and color by the fifth day of breastfeeding.  Nipples that are not sore, cracked, or bleeding.  Signs That Your Baby is Getting Enough Milk  Wetting at least 1-2 diapers during the first 24 hours after birth.  Wetting at least 5-6 diapers every 24 hours for the first week after birth. The urine should be clear or pale yellow by 5 days after birth.  Wetting 6-8 diapers every 24 hours as your baby continues to grow and develop.  At least 3 stools in a 24-hour period by age 5 days. The stool should be soft and yellow.  At least 3 stools in a 24-hour period by age 7 days. The stool should be seedy and yellow.  No loss of weight greater than 10% of birth weight during the first 3 days of age.  Average  weight gain of 4-7 ounces (113-198 g) per week after age 4 days.  Consistent daily weight gain by age 5 days, without weight loss after the age of 2 weeks.  After a feeding, your baby may spit up a small amount. This is common. Breastfeeding frequency and duration Frequent feeding will help you make more milk and can prevent sore nipples and breast engorgement. Breastfeed when you feel the need to reduce the fullness of your breasts or when your baby shows signs of hunger. This is called "breastfeeding on demand." Avoid introducing a pacifier to your baby while you are working to establish breastfeeding (the first 4-6 weeks after your baby is born). After this time you may choose to use a pacifier. Research has shown that pacifier use during the first year of a baby's life decreases the risk of sudden infant death syndrome (SIDS). Allow your baby to feed on each breast as long as he or she wants. Breastfeed until your baby is finished feeding. When your baby unlatches or falls asleep while feeding from the first breast, offer the second breast. Because newborns are often sleepy in the first few weeks of life, you may need to awaken your baby to get him or her to feed. Breastfeeding times will vary from baby to baby. However, the following rules can serve as a guide to help you ensure that your baby is properly fed:  Newborns (babies 4 weeks of age or younger) may breastfeed every 1-3 hours.  Newborns should not go longer than 3 hours during the day or 5 hours during the night without breastfeeding.  You should breastfeed your baby a minimum of 8 times in a 24-hour period until you begin to introduce solid foods to your   baby at around 6 months of age.  Breast milk pumping Pumping and storing breast milk allows you to ensure that your baby is exclusively fed your breast milk, even at times when you are unable to breastfeed. This is especially important if you are going back to work while you are still  breastfeeding or when you are not able to be present during feedings. Your lactation consultant can give you guidelines on how long it is safe to store breast milk. A breast pump is a machine that allows you to pump milk from your breast into a sterile bottle. The pumped breast milk can then be stored in a refrigerator or freezer. Some breast pumps are operated by hand, while others use electricity. Ask your lactation consultant which type will work best for you. Breast pumps can be purchased, but some hospitals and breastfeeding support groups lease breast pumps on a monthly basis. A lactation consultant can teach you how to hand express breast milk, if you prefer not to use a pump. Caring for your breasts while you breastfeed Nipples can become dry, cracked, and sore while breastfeeding. The following recommendations can help keep your breasts moisturized and healthy:  Avoid using soap on your nipples.  Wear a supportive bra. Although not required, special nursing bras and tank tops are designed to allow access to your breasts for breastfeeding without taking off your entire bra or top. Avoid wearing underwire-style bras or extremely tight bras.  Air dry your nipples for 3-4minutes after each feeding.  Use only cotton bra pads to absorb leaked breast milk. Leaking of breast milk between feedings is normal.  Use lanolin on your nipples after breastfeeding. Lanolin helps to maintain your skin's normal moisture barrier. If you use pure lanolin, you do not need to wash it off before feeding your baby again. Pure lanolin is not toxic to your baby. You may also hand express a few drops of breast milk and gently massage that milk into your nipples and allow the milk to air dry.  In the first few weeks after giving birth, some women experience extremely full breasts (engorgement). Engorgement can make your breasts feel heavy, warm, and tender to the touch. Engorgement peaks within 3-5 days after you give  birth. The following recommendations can help ease engorgement:  Completely empty your breasts while breastfeeding or pumping. You may want to start by applying warm, moist heat (in the shower or with warm water-soaked hand towels) just before feeding or pumping. This increases circulation and helps the milk flow. If your baby does not completely empty your breasts while breastfeeding, pump any extra milk after he or she is finished.  Wear a snug bra (nursing or regular) or tank top for 1-2 days to signal your body to slightly decrease milk production.  Apply ice packs to your breasts, unless this is too uncomfortable for you.  Make sure that your baby is latched on and positioned properly while breastfeeding.  If engorgement persists after 48 hours of following these recommendations, contact your health care provider or a lactation consultant. Overall health care recommendations while breastfeeding  Eat healthy foods. Alternate between meals and snacks, eating 3 of each per day. Because what you eat affects your breast milk, some of the foods may make your baby more irritable than usual. Avoid eating these foods if you are sure that they are negatively affecting your baby.  Drink milk, fruit juice, and water to satisfy your thirst (about 10 glasses a day).    Rest often, relax, and continue to take your prenatal vitamins to prevent fatigue, stress, and anemia.  Continue breast self-awareness checks.  Avoid chewing and smoking tobacco. Chemicals from cigarettes that pass into breast milk and exposure to secondhand smoke may harm your baby.  Avoid alcohol and drug use, including marijuana. Some medicines that may be harmful to your baby can pass through breast milk. It is important to ask your health care provider before taking any medicine, including all over-the-counter and prescription medicine as well as vitamin and herbal supplements. It is possible to become pregnant while breastfeeding.  If birth control is desired, ask your health care provider about options that will be safe for your baby. Contact a health care provider if:  You feel like you want to stop breastfeeding or have become frustrated with breastfeeding.  You have painful breasts or nipples.  Your nipples are cracked or bleeding.  Your breasts are red, tender, or warm.  You have a swollen area on either breast.  You have a fever or chills.  You have nausea or vomiting.  You have drainage other than breast milk from your nipples.  Your breasts do not become full before feedings by the fifth day after you give birth.  You feel sad and depressed.  Your baby is too sleepy to eat well.  Your baby is having trouble sleeping.  Your baby is wetting less than 3 diapers in a 24-hour period.  Your baby has less than 3 stools in a 24-hour period.  Your baby's skin or the white part of his or her eyes becomes yellow.  Your baby is not gaining weight by 5 days of age. Get help right away if:  Your baby is overly tired (lethargic) and does not want to wake up and feed.  Your baby develops an unexplained fever. This information is not intended to replace advice given to you by your health care provider. Make sure you discuss any questions you have with your health care provider. Document Released: 09/07/2005 Document Revised: 02/19/2016 Document Reviewed: 03/01/2013 [employer] Interactive Patient Education  2017 [employer]  

## 2016-11-30 NOTE — Progress Notes (Signed)
   PRENATAL VISIT NOTE  Subjective:  Vicki Mcdonald is a 21 y.o. G3P1011 at 5667w1d being seen today for ongoing prenatal care.  She is currently monitored for the following issues for this low-risk pregnancy and has Hemoglobin C trait (HCC); Rh negative state in antepartum period; and Supervision of normal pregnancy on her problem list.  Patient reports no complaints.  Contractions: Not present. Vag. Bleeding: None.  Movement: Present. Denies leaking of fluid.   The following portions of the patient's history were reviewed and updated as appropriate: allergies, current medications, past family history, past medical history, past social history, past surgical history and problem list. Problem list updated.  Objective:   Vitals:   11/30/16 1002  BP: 114/64  Pulse: 85  Weight: 172 lb (78 kg)    Fetal Status:   Fundal Height: 24 cm Movement: Present     General:  Alert, oriented and cooperative. Patient is in no acute distress.  Skin: Skin is warm and dry. No rash noted.   Cardiovascular: Normal heart rate noted  Respiratory: Normal respiratory effort, no problems with respiration noted  Abdomen: Soft, gravid, appropriate for gestational age. Pain/Pressure: Absent     Pelvic:  Cervical exam deferred        Extremities: Normal range of motion.  Edema: None  Mental Status: Normal mood and affect. Normal behavior. Normal judgment and thought content.   Assessment and Plan:  Pregnancy: G3P1011 at 1667w1d  1. Encounter for supervision of normal pregnancy in second trimester, unspecified gravidity Continue routine prenatal care.  2. Rh negative state in antepartum period Reports FOB is negative, 1st child is negative. If provides proof and is sure of paternity, would not need Rhogam.  Preterm labor symptoms and general obstetric precautions including but not limited to vaginal bleeding, contractions, leaking of fluid and fetal movement were reviewed in detail with the patient. Please refer  to After Visit Summary for other counseling recommendations.  Return in 4 weeks (on 12/28/2016) for 28 wk labs.   Reva Boresanya S Johnanthony Wilden, MD

## 2016-12-25 ENCOUNTER — Ambulatory Visit (INDEPENDENT_AMBULATORY_CARE_PROVIDER_SITE_OTHER): Payer: Medicaid Other | Admitting: Family Medicine

## 2016-12-25 VITALS — BP 113/66 | HR 68 | Wt 182.0 lb

## 2016-12-25 DIAGNOSIS — Z6791 Unspecified blood type, Rh negative: Secondary | ICD-10-CM

## 2016-12-25 DIAGNOSIS — O26899 Other specified pregnancy related conditions, unspecified trimester: Secondary | ICD-10-CM

## 2016-12-25 DIAGNOSIS — Z23 Encounter for immunization: Secondary | ICD-10-CM | POA: Diagnosis not present

## 2016-12-25 DIAGNOSIS — Z3482 Encounter for supervision of other normal pregnancy, second trimester: Secondary | ICD-10-CM

## 2016-12-25 DIAGNOSIS — Z3492 Encounter for supervision of normal pregnancy, unspecified, second trimester: Secondary | ICD-10-CM

## 2016-12-25 DIAGNOSIS — Z3490 Encounter for supervision of normal pregnancy, unspecified, unspecified trimester: Secondary | ICD-10-CM

## 2016-12-25 DIAGNOSIS — O09899 Supervision of other high risk pregnancies, unspecified trimester: Secondary | ICD-10-CM

## 2016-12-25 MED ORDER — RHO D IMMUNE GLOBULIN 1500 UNITS IM SOSY: 1500.0000 [IU] | PREFILLED_SYRINGE | Freq: Once | INTRAMUSCULAR | Status: DC

## 2016-12-25 NOTE — Progress Notes (Signed)
   PRENATAL VISIT NOTE  Subjective:  Vicki Mcdonald is a 21 y.o. G3P1011 at [redacted]w[redacted]d being seen today for ongoing prenatal care.  She is currently monitored for the following issues for this low-risk pregnancy and has Hemoglobin C trait (HCC); Rh negative state in antepartum period; and Supervision of normal pregnancy on her problem list.  Patient reports no complaints.  Contractions: Not present.  .  Movement: Present. Denies leaking of fluid.   The following portions of the patient's history were reviewed and updated as appropriate: allergies, current medications, past family history, past medical history, past social history, past surgical history and problem list. Problem list updated.  Objective:   Vitals:   12/25/16 0835  BP: 113/66  Pulse: 68  Weight: 182 lb (82.6 kg)    Fetal Status: Fetal Heart Rate (bpm): 148 Fundal Height: 27 cm Movement: Present     General:  Alert, oriented and cooperative. Patient is in no acute distress.  Skin: Skin is warm and dry. No rash noted.   Cardiovascular: Normal heart rate noted  Respiratory: Normal respiratory effort, no problems with respiration noted  Abdomen: Soft, gravid, appropriate for gestational age. Pain/Pressure: Absent     Pelvic:  Cervical exam deferred        Extremities: Normal range of motion.  Edema: None  Mental Status: Normal mood and affect. Normal behavior. Normal judgment and thought content.   Assessment and Plan:  Pregnancy: G3P1011 at [redacted]w[redacted]d  1. Prenatal care, antepartum 28 week labs next week.  2. Encounter for supervision of normal pregnancy in second trimester, unspecified gravidity Fht and FH normal  3. Rh negative state in antepartum period Rhogam after blood work  Preterm labor symptoms and general obstetric precautions including but not limited to vaginal bleeding, contractions, leaking of fluid and fetal movement were reviewed in detail with the patient. Please refer to After Visit Summary for other  counseling recommendations.  No Follow-up on file.   Levie Heritage, DO

## 2016-12-25 NOTE — Progress Notes (Signed)
Unable to obtain stick for gtt. Patient given instructions to come next week when lab is open. Armandina Stammer RNBSN

## 2016-12-31 ENCOUNTER — Other Ambulatory Visit: Payer: Self-pay | Admitting: Family Medicine

## 2016-12-31 ENCOUNTER — Other Ambulatory Visit: Payer: Medicaid Other

## 2016-12-31 DIAGNOSIS — Z3490 Encounter for supervision of normal pregnancy, unspecified, unspecified trimester: Secondary | ICD-10-CM

## 2016-12-31 MED ORDER — RHO D IMMUNE GLOBULIN 1500 UNIT/2ML IJ SOSY: 300.0000 ug | PREFILLED_SYRINGE | Freq: Once | INTRAMUSCULAR | Status: DC

## 2016-12-31 NOTE — Progress Notes (Unsigned)
Patient to complete 28 week labs with [facility] and then return for Rho gam.

## 2017-01-01 LAB — CBC
HEMATOCRIT: 32.2 % — AB (ref 34.0–46.6)
Hemoglobin: 10.6 g/dL — ABNORMAL LOW (ref 11.1–15.9)
MCH: 29.5 pg (ref 26.6–33.0)
MCHC: 32.9 g/dL (ref 31.5–35.7)
MCV: 90 fL (ref 79–97)
Platelets: 198 10*3/uL (ref 150–379)
RBC: 3.59 x10E6/uL — ABNORMAL LOW (ref 3.77–5.28)
RDW: 12.6 % (ref 12.3–15.4)
WBC: 10.7 10*3/uL (ref 3.4–10.8)

## 2017-01-01 LAB — RPR: RPR: NONREACTIVE

## 2017-01-01 LAB — HIV ANTIBODY (ROUTINE TESTING W REFLEX): HIV SCREEN 4TH GENERATION: NONREACTIVE

## 2017-01-01 LAB — ANTIBODY SCREEN: Antibody Screen: NEGATIVE

## 2017-01-01 LAB — GLUCOSE TOLERANCE, 2 HOURS W/ 1HR
GLUCOSE, 1 HOUR: 80 mg/dL (ref 65–179)
GLUCOSE, FASTING: 67 mg/dL (ref 65–91)
Glucose, 2 hour: 105 mg/dL (ref 65–152)

## 2017-01-22 ENCOUNTER — Ambulatory Visit (INDEPENDENT_AMBULATORY_CARE_PROVIDER_SITE_OTHER): Payer: Medicaid Other | Admitting: Obstetrics & Gynecology

## 2017-01-22 VITALS — BP 113/69 | HR 84 | Wt 188.0 lb

## 2017-01-22 DIAGNOSIS — Z3483 Encounter for supervision of other normal pregnancy, third trimester: Secondary | ICD-10-CM

## 2017-01-22 DIAGNOSIS — Z6791 Unspecified blood type, Rh negative: Secondary | ICD-10-CM

## 2017-01-22 DIAGNOSIS — O26899 Other specified pregnancy related conditions, unspecified trimester: Principal | ICD-10-CM

## 2017-01-22 DIAGNOSIS — O09893 Supervision of other high risk pregnancies, third trimester: Secondary | ICD-10-CM

## 2017-01-22 NOTE — Progress Notes (Signed)
   PRENATAL VISIT NOTE  Subjective:  Vicki Mcdonald is a 21 y.o. M G3P1011 at 638w5d being seen today for ongoing prenatal care.  She is currently monitored for the following issues for this low-risk pregnancy and has Hemoglobin C trait (HCC); Rh negative state in antepartum period; and Supervision of normal pregnancy on her problem list.  Patient reports no complaints.  Contractions: Not present. Vag. Bleeding: None.  Movement: Present. Denies leaking of fluid.   The following portions of the patient's history were reviewed and updated as appropriate: allergies, current medications, past family history, past medical history, past social history, past surgical history and problem list. Problem list updated.  Objective:   Vitals:   01/22/17 0916  BP: 113/69  Pulse: 84  Weight: 188 lb (85.3 kg)    Fetal Status: Fetal Heart Rate (bpm): 148   Movement: Present     General:  Alert, oriented and cooperative. Patient is in no acute distress.  Skin: Skin is warm and dry. No rash noted.   Cardiovascular: Normal heart rate noted  Respiratory: Normal respiratory effort, no problems with respiration noted  Abdomen: Soft, gravid, appropriate for gestational age. Pain/Pressure: Absent     Pelvic:  Cervical exam deferred        Extremities: Normal range of motion.  Edema: Trace  Mental Status: Normal mood and affect. Normal behavior. Normal judgment and thought content.   Assessment and Plan:  Pregnancy: G3P1011 at 678w5d  1. Rh negative state in antepartum period - Her husband is A- and she declines rhophylac  2. Encounter for supervision of other normal pregnancy in third trimester - [facility] as she is planning a water birth  Preterm labor symptoms and general obstetric precautions including but not limited to vaginal bleeding, contractions, leaking of fluid and fetal movement were reviewed in detail with the patient. Please refer to After Visit  Summary for other counseling recommendations.  Return in about 2 weeks (around 02/05/2017).   Allie BossierMyra C Daronte Shostak, MD

## 2017-02-05 ENCOUNTER — Ambulatory Visit (INDEPENDENT_AMBULATORY_CARE_PROVIDER_SITE_OTHER): Payer: Medicaid Other | Admitting: Family Medicine

## 2017-02-05 VITALS — BP 117/67 | HR 86 | Wt 192.0 lb

## 2017-02-05 DIAGNOSIS — M549 Dorsalgia, unspecified: Secondary | ICD-10-CM

## 2017-02-05 DIAGNOSIS — M9903 Segmental and somatic dysfunction of lumbar region: Secondary | ICD-10-CM

## 2017-02-05 DIAGNOSIS — M9905 Segmental and somatic dysfunction of pelvic region: Secondary | ICD-10-CM

## 2017-02-05 DIAGNOSIS — O9989 Other specified diseases and conditions complicating pregnancy, childbirth and the puerperium: Secondary | ICD-10-CM

## 2017-02-05 DIAGNOSIS — O99891 Other specified diseases and conditions complicating pregnancy: Secondary | ICD-10-CM

## 2017-02-05 DIAGNOSIS — M9904 Segmental and somatic dysfunction of sacral region: Secondary | ICD-10-CM

## 2017-02-05 DIAGNOSIS — Z3483 Encounter for supervision of other normal pregnancy, third trimester: Secondary | ICD-10-CM

## 2017-02-05 NOTE — Progress Notes (Signed)
   PRENATAL VISIT NOTE  Subjective:  Vicki Mcdonald is a 21 y.o. G3P1011 at 7650w5d being seen today for ongoing prenatal care.  She is currently monitored for the following issues for this low-risk pregnancy and has Hemoglobin C trait (HCC); Rh negative state in antepartum period; and Supervision of normal pregnancy on her problem list.  Patient reports no complaints.  Contractions: Not present. Vag. Bleeding: None.  Movement: Present. Denies leaking of fluid.   The following portions of the patient's history were reviewed and updated as appropriate: allergies, current medications, past family history, past medical history, past social history, past surgical history and problem list. Problem list updated.  Objective:   Vitals:   02/05/17 0936  BP: 117/67  Pulse: 86  Weight: 192 lb (87.1 kg)    Fetal Status: Fetal Heart Rate (bpm): 144 Fundal Height: 34 cm Movement: Present  Presentation: Vertex  General:  Alert, oriented and cooperative. Patient is in no acute distress.  Skin: Skin is warm and dry. No rash noted.   Cardiovascular: Normal heart rate noted  Respiratory: Normal respiratory effort, no problems with respiration noted  Abdomen: Soft, gravid, appropriate for gestational age. Pain/Pressure: Absent     Pelvic:  Cervical exam deferred        MSK: Restriction, tenderness, tissue texture changes, and paraspinal spasm in the right lumbar spine  Neuro: Moves all four extremities with no focal neurological deficit  Extremities: Normal range of motion.  Edema: Trace  Mental Status: Normal mood and affect. Normal behavior. Normal judgment and thought content.   OSE: Head   Cervical   Thoracic   Rib   Lumbar  ESRR  Sacrum  L/L  Pelvis  right ant    Assessment and Plan:  Pregnancy: G3P1011 at 3750w5d  1. Encounter for supervision of other normal pregnancy in third trimester FHT and FH normal. Culture next visit. Planning on water birth  2. Back pain affecting pregnancy in  second trimester 3. Somatic dysfunction of lumbar region 4. Somatic dysfunction of sacral region 5. Somatic dysfunction of pelvis region OMT done after patient permission. HVLA technique utilized. Patient tolerated procedure well.    Preterm labor symptoms and general obstetric precautions including but not limited to vaginal bleeding, contractions, leaking of fluid and fetal movement were reviewed in detail with the patient. Please refer to After Visit Summary for other counseling recommendations.  Return in about 2 weeks (around 02/19/2017) for OB f/u.  Levie HeritageStinson, Jacob J, DO

## 2017-02-19 ENCOUNTER — Encounter: Payer: Medicaid Other | Admitting: Obstetrics & Gynecology

## 2017-02-24 ENCOUNTER — Inpatient Hospital Stay (HOSPITAL_COMMUNITY)
Admission: AD | Admit: 2017-02-24 | Discharge: 2017-02-25 | Disposition: A | Discharge status: Home / Self Care | Payer: Medicaid Other | Source: Ambulatory Visit | Attending: Obstetrics & Gynecology | Admitting: Obstetrics & Gynecology

## 2017-02-24 DIAGNOSIS — Z87440 Personal history of urinary (tract) infections: Secondary | ICD-10-CM | POA: Insufficient documentation

## 2017-02-24 DIAGNOSIS — Z3A36 36 weeks gestation of pregnancy: Secondary | ICD-10-CM | POA: Diagnosis not present

## 2017-02-24 DIAGNOSIS — Z888 Allergy status to other drugs, medicaments and biological substances status: Secondary | ICD-10-CM | POA: Diagnosis not present

## 2017-02-24 DIAGNOSIS — Z809 Family history of malignant neoplasm, unspecified: Secondary | ICD-10-CM | POA: Insufficient documentation

## 2017-02-24 DIAGNOSIS — O9989 Other specified diseases and conditions complicating pregnancy, childbirth and the puerperium: Secondary | ICD-10-CM

## 2017-02-24 DIAGNOSIS — O26893 Other specified pregnancy related conditions, third trimester: Secondary | ICD-10-CM | POA: Diagnosis not present

## 2017-02-24 DIAGNOSIS — Z833 Family history of diabetes mellitus: Secondary | ICD-10-CM | POA: Insufficient documentation

## 2017-02-24 DIAGNOSIS — M7989 Other specified soft tissue disorders: Secondary | ICD-10-CM | POA: Diagnosis not present

## 2017-02-24 DIAGNOSIS — Z8049 Family history of malignant neoplasm of other genital organs: Secondary | ICD-10-CM | POA: Insufficient documentation

## 2017-02-24 LAB — URINALYSIS, ROUTINE W REFLEX MICROSCOPIC
BILIRUBIN URINE: NEGATIVE
Glucose, UA: NEGATIVE mg/dL
Ketones, ur: NEGATIVE mg/dL
LEUKOCYTES UA: NEGATIVE
NITRITE: NEGATIVE
PH: 6 (ref 5.0–8.0)
Protein, ur: NEGATIVE mg/dL
SPECIFIC GRAVITY, URINE: 1.011 (ref 1.005–1.030)

## 2017-02-24 NOTE — MAU Provider Note (Signed)
History     CSN: [id number]  Arrival date and time: 02/24/17 2211   First Provider Initiated Contact with Patient 02/24/17 2333      Chief Complaint  Patient presents with  . Leg Swelling  . hand swelling   HPI  [name] is a 21 yo G3P1011 at 6336.3 gestation presenting with complaints of sudden RT hand and foot swelling.  She has not been very active today.  She denies H/A, RUQ pain or dizziness.  She has never had this experience in this pregnancy before.  [facility] where her pregnancy has been uncomplicated.  Past Medical History:  Diagnosis Date  . Anemia   . Anxiety    no meds  . Blood transfusion without reported diagnosis 2014   x3  . Hemoglobin C trait (HCC)   . History of blood transfusion 01/17/2014   X 3 09/2012   . Infection    UTI  . Urinary tract infection     Past Surgical History:  Procedure Laterality Date  . NO PAST SURGERIES      Family History  Problem Relation Age of Onset  . Thyroid disease Mother   . Diabetes Maternal Grandmother   . Cancer Maternal Grandmother        cervical  . Cancer Maternal Grandfather   . Hearing loss Neg Hx     Social History  Substance Use Topics  . Smoking status: Never Smoker  . Smokeless tobacco: Never Used  . Alcohol use No    Allergies:  Allergies  Allergen Reactions  . Diflucan [Fluconazole] Hives    Facility-Administered Medications Prior to Admission  Medication Dose Route Frequency Provider Last Rate Last Dose  . rho (d) immune globulin (RHIG/RHOPHYLAC) injection 300 mcg  300 mcg Intramuscular Once Levie HeritageStinson, Jacob J, DO       Prescriptions Prior to Admission  Medication Sig Dispense Refill Last Dose  . Prenatal Vit-Fe Fumarate-FA (PRENATAL MULTIVITAMIN) TABS tablet Take 1 tablet by mouth daily at 12 noon. 30 tablet 12 Taking    Review of Systems  Constitutional: Negative.   HENT: Negative.   Eyes: Negative.   Respiratory: Negative.   Cardiovascular: Positive for  leg swelling (RT foot). Negative for chest pain.  Gastrointestinal: Negative.   Endocrine: Negative.   Genitourinary: Negative.   Musculoskeletal: Negative.   Skin: Negative.   Allergic/Immunologic: Negative.   Neurological: Negative.   Hematological: Negative.   Psychiatric/Behavioral: Negative.    Physical Exam   Blood pressure 117/68, pulse 71, temperature 98.4 F (36.9 C), temperature source Oral, resp. rate 18, height 5\' 6"  (1.676 m), weight 89.8 kg (198 lb), last menstrual period 06/14/2016, SpO2 99 %, currently breastfeeding.  Physical Exam  Constitutional: She is oriented to person, place, and time. She appears well-developed and well-nourished.  HENT:  Head: Normocephalic.  Eyes: Pupils are equal, round, and reactive to light.  Neck: Normal range of motion.  Cardiovascular: Normal rate, regular rhythm, normal heart sounds and intact distal pulses.   Respiratory: Effort normal and breath sounds normal.  GI: Soft. Bowel sounds are normal.  Musculoskeletal: Normal range of motion.  No edema detected in either hand or LE  Neurological: She is alert and oriented to person, place, and time. She has normal reflexes.  Skin: Skin is warm and dry.  Psychiatric: She has a normal mood and affect. Her behavior is normal. Judgment and thought content normal.   CEFM  FHR: 130 bpm / moderate variability / accels  present / decels absent TOCO: none  MAU Course  Procedures  MDM NST - reactive  Assessment and Plan  Swelling of right lower extremity - Elevate lower extremities - Increase daily water intake to at least 8 bottles  - F/U with OB at Friday 6/8 appt  Discharge home Patient verbalized an understanding of the plan of care and agrees.   Raelyn Mora MSN, CNM 02/24/2017, 11:38 PM

## 2017-02-24 NOTE — Discharge Instructions (Signed)
Edema Edema is an abnormal buildup of fluids in your bodytissues. Edema is somewhatdependent on gravity to pull the fluid to the lowest place in your body. That makes the condition more common in the legs and thighs (lower extremities). Painless swelling of the feet and ankles is common and becomes more likely as you get older. It is also common in looser tissues, like around your eyes. When the affected area is squeezed, the fluid may move out of that spot and leave a dent for a few moments. This dent is called pitting. What are the causes? There are many possible causes of edema. Eating too much salt and being on your feet or sitting for a long time can cause edema in your legs and ankles. Hot weather may make edema worse. Common medical causes of edema include:  Heart failure.  Liver disease.  Kidney disease.  Weak blood vessels in your legs.  Cancer.  An injury.  Pregnancy.  Some medications.  Obesity.  What are the signs or symptoms? Edema is usually painless.Your skin may look swollen or shiny. How is this diagnosed? Your health care provider may be able to diagnose edema by asking about your medical history and doing a physical exam. You may need to have tests such as X-rays, an electrocardiogram, or blood tests to check for medical conditions that may cause edema. How is this treated? Edema treatment depends on the cause. If you have heart, liver, or kidney disease, you need the treatment appropriate for these conditions. General treatment may include:  Elevation of the affected body part above the level of your heart.  Compression of the affected body part. Pressure from elastic bandages or support stockings squeezes the tissues and forces fluid back into the blood vessels. This keeps fluid from entering the tissues.  Restriction of fluid and salt intake.  Use of a water pill (diuretic). These medications are appropriate only for some types of edema. They pull fluid  out of your body and make you urinate more often. This gets rid of fluid and reduces swelling, but diuretics can have side effects. Only use diuretics as directed by your health care provider.  Follow these instructions at home:  Keep the affected body part above the level of your heart when you are lying down.  Do not sit still or stand for prolonged periods.  Do not put anything directly under your knees when lying down.  Do not wear constricting clothing or garters on your upper legs.  Exercise your legs to work the fluid back into your blood vessels. This may help the swelling go down.  Wear elastic bandages or support stockings to reduce ankle swelling as directed by your health care provider.  Eat a low-salt diet to reduce fluid if your health care provider recommends it.  Only take medicines as directed by your health care provider. Contact a health care provider if:  Your edema is not responding to treatment.  You have heart, liver, or kidney disease and notice symptoms of edema.  You have edema in your legs that does not improve after elevating them.  You have sudden and unexplained weight gain. Get help right away if:  You develop shortness of breath or chest pain.  You cannot breathe when you lie down.  You develop pain, redness, or warmth in the swollen areas.  You have heart, liver, or kidney disease and suddenly get edema.  You have a fever and your symptoms suddenly get worse. This information is   not intended to replace advice given to you by your health care provider. Make sure you discuss any questions you have with your health care provider. Document Released: 09/07/2005 Document Revised: 02/13/2016 Document Reviewed: 06/30/2013 Elsevier Interactive Patient Education  2017 [employer]vier Inc.  

## 2017-02-24 NOTE — MAU Note (Signed)
Pt here with c/o swelling in right hand and right foot, now on right leg as well. Denies any headache or visual changes. Denies any bleeding or leaking of fluid. Having occasional "braxton hicks contractions"

## 2017-02-26 ENCOUNTER — Encounter: Payer: Self-pay | Admitting: Advanced Practice Midwife

## 2017-02-26 ENCOUNTER — Ambulatory Visit (INDEPENDENT_AMBULATORY_CARE_PROVIDER_SITE_OTHER): Payer: Medicaid Other | Admitting: Advanced Practice Midwife

## 2017-02-26 ENCOUNTER — Other Ambulatory Visit (HOSPITAL_COMMUNITY)
Admission: RE | Admit: 2017-02-26 | Discharge: 2017-02-26 | Disposition: A | Discharge status: Home / Self Care | Payer: Medicaid Other | Source: Ambulatory Visit | Attending: Advanced Practice Midwife | Admitting: Advanced Practice Midwife

## 2017-02-26 VITALS — BP 112/64 | HR 83 | Wt 196.0 lb

## 2017-02-26 DIAGNOSIS — Z6791 Unspecified blood type, Rh negative: Secondary | ICD-10-CM

## 2017-02-26 DIAGNOSIS — Z3483 Encounter for supervision of other normal pregnancy, third trimester: Secondary | ICD-10-CM | POA: Diagnosis not present

## 2017-02-26 DIAGNOSIS — O26899 Other specified pregnancy related conditions, unspecified trimester: Secondary | ICD-10-CM

## 2017-02-26 DIAGNOSIS — Z3480 Encounter for supervision of other normal pregnancy, unspecified trimester: Secondary | ICD-10-CM

## 2017-02-26 LAB — OB RESULTS CONSOLE GBS: STREP GROUP B AG: NEGATIVE

## 2017-02-26 NOTE — Progress Notes (Signed)
   PRENATAL VISIT NOTE  Subjective:  Vicki Mcdonald is a 21 y.o. G3P1011 at 8930w5d being seen today for ongoing prenatal care.  She is currently monitored for the following issues for this low-risk pregnancy and has Hemoglobin C trait (HCC); Rh negative state in antepartum period; and Supervision of normal pregnancy on her problem list.  Patient reports occasional contractions.  Contractions: Irritability. Vag. Bleeding: None.  Movement: Present. Denies leaking of fluid.   The following portions of the patient's history were reviewed and updated as appropriate: allergies, current medications, past family history, past medical history, past social history, past surgical history and problem list. Problem list updated.  Objective:   Vitals:   02/26/17 0943  BP: 112/64  Pulse: 83  Weight: 196 lb (88.9 kg)    Fetal Status: Fetal Heart Rate (bpm): 142 Fundal Height: 37 cm Movement: Present  Presentation: Vertex  General:  Alert, oriented and cooperative. Patient is in no acute distress.  Skin: Skin is warm and dry. No rash noted.   Cardiovascular: Normal heart rate noted  Respiratory: Normal respiratory effort, no problems with respiration noted  Abdomen: Soft, gravid, appropriate for gestational age. Pain/Pressure: Absent     Pelvic:  Cervical exam performed Dilation: Closed Effacement (%): 0 Station: Ballotable  Extremities: Normal range of motion.  Edema: Trace  Mental Status: Normal mood and affect. Normal behavior. Normal judgment and thought content.   Assessment and Plan:  Pregnancy: G3P1011 at 3330w5d  1. Supervision of other normal pregnancy, antepartum  - Culture, beta strep (group b only) - Urine cytology ancillary only - Waterbirth Consent signed. Contraindications discusses. Has supplies. List reviewed.  Term labor symptoms and general obstetric precautions including but not limited to vaginal bleeding, contractions, leaking of fluid and fetal movement were reviewed in  detail with the patient. Please refer to After Visit Summary for other counseling recommendations.  Return in about 1 week (around 03/05/2017) for ROB.   Dorathy KinsmanVirginia Elio Haden, CNM

## 2017-02-26 NOTE — Patient Instructions (Signed)
Considering Waterbirth? Guide for patients at [facility]  Why consider waterbirth?  . Gentle birth for babies . Less pain medicine used in labor . May allow for passive descent/less pushing . May reduce perineal tears  . More mobility and instinctive maternal position changes . Increased maternal relaxation . Reduced blood pressure in labor  Is waterbirth safe? What are the risks of infection, drowning or other complications?  . Infection: o Very low risk (3.7 % for tub vs 4.8% for bed) o 7 in 8000 waterbirths with documented infection o Poorly cleaned equipment most common cause o Slightly lower group B strep transmission rate  . Drowning o Maternal:  - Very low risk   - Related to seizures or fainting o Newborn:  - Very low risk. No evidence of increased risk of respiratory problems in multiple large studies - Physiological protection from breathing under water - Avoid underwater birth if there are any fetal complications - Once baby's head is out of the water, keep it out.  . Birth complication o Some reports of cord trauma, but risk decreased by bringing baby to surface gradually o No evidence of increased risk of shoulder dystocia. Mothers can usually change positions faster in water than in a bed, possibly aiding the maneuvers to free the shoulder.   Am I a candidate for waterbirth?  Yes, if you are: . Full-term (37 weeks or greater)  . Have had an uncomplicated pregnancy and labor  No, if you have: . Preterm birth less than 37 weeks . Thick, particulate meconium stained fluid . Maternal fever over 101 . Heavy bleeding or signs of placental abruption . Pre-eclampsia  . Any abnormal fetal heart rate pattern . Breech presentation . Twins  . Very large baby . Active communicable infection (this does NOT include group B strep) . Significant limitation to mobility  Please remember that birth is unpredictable. Under certain unforeseeable  circumstances your provider may advise against giving birth in the tub. These decisions will be made on a case-by-case basis and with the safety of you and your baby as our highest priority.  Requirements for patients planning waterbirth  . Ask your midwife if you will be a candidate for waterbirth. . Attend the Waterbirth Class at [facility]. Contact Childbirth Education at [phone] or [phone] for dates and times. The class is free and we strongly encourage you to bring your support person. You will receive a certificate of participation to show to your midwife or doctor. . Supplies needed for [facility] and [facility] patients: o Single-use disposable tub liner (birthpoolinabox.com  REGULAR size) o New garden hose labeled "lead-free", "suitable for drinking water", "non-toxic" OR "water potable" o Garden hose to remove the dirty water o Faucet adaptor to attach hose to faucet         o Electric drain pump to remove water (We recommend 792 gallon per hour or greater pump.)  o Fish net o Bathing suit top (optional) o Long-handled mirror (optional)  yourwaterbirth.com sells tubs for $120 if you would rather purchase your own tub     

## 2017-03-01 ENCOUNTER — Encounter: Payer: Self-pay | Admitting: *Deleted

## 2017-03-01 LAB — URINE CYTOLOGY ANCILLARY ONLY
Chlamydia: NEGATIVE
Neisseria Gonorrhea: NEGATIVE

## 2017-03-01 LAB — CULTURE, BETA STREP (GROUP B ONLY)

## 2017-03-03 ENCOUNTER — Ambulatory Visit (INDEPENDENT_AMBULATORY_CARE_PROVIDER_SITE_OTHER): Payer: Medicaid Other | Admitting: Obstetrics & Gynecology

## 2017-03-03 VITALS — BP 111/65 | HR 80 | Wt 197.0 lb

## 2017-03-03 DIAGNOSIS — Z3483 Encounter for supervision of other normal pregnancy, third trimester: Secondary | ICD-10-CM

## 2017-03-03 NOTE — Progress Notes (Signed)
   PRENATAL VISIT NOTE  Subjective:  Vicki Mcdonald is a 21 y.o. G3P1011 at 9059w3d being seen today for ongoing prenatal care.  She is currently monitored for the following issues for this low-risk pregnancy and has Hemoglobin C trait (HCC); Rh negative state in antepartum period; and Supervision of normal pregnancy on her problem list.  Patient reports no complaints.  Contractions: Irritability. Vag. Bleeding: None.  Movement: Present. Denies leaking of fluid.   The following portions of the patient's history were reviewed and updated as appropriate: allergies, current medications, past family history, past medical history, past social history, past surgical history and problem list. Problem list updated.  Objective:   Vitals:   03/03/17 1347  BP: 111/65  Pulse: 80  Weight: 197 lb (89.4 kg)    Fetal Status: Fetal Heart Rate (bpm): 140   Movement: Present     General:  Alert, oriented and cooperative. Patient is in no acute distress.  Skin: Skin is warm and dry. No rash noted.   Cardiovascular: Normal heart rate noted  Respiratory: Normal respiratory effort, no problems with respiration noted  Abdomen: Soft, gravid, appropriate for gestational age. Pain/Pressure: Absent     Pelvic:  Cervical exam performed        Extremities: Normal range of motion.  Edema: Trace  Mental Status: Normal mood and affect. Normal behavior. Normal judgment and thought content.   Assessment and Plan:  Pregnancy: G3P1011 at 3059w3d  1. Encounter for supervision of other normal pregnancy in third trimester   Term labor symptoms and general obstetric precautions including but not limited to vaginal bleeding, contractions, leaking of fluid and fetal movement were reviewed in detail with the patient. Please refer to After Visit Summary for other counseling recommendations.  No Follow-up on file.   Vicki BossierMyra C Hester Joslin, MD

## 2017-03-05 ENCOUNTER — Encounter: Payer: Medicaid Other | Admitting: Obstetrics & Gynecology

## 2017-03-10 ENCOUNTER — Ambulatory Visit (INDEPENDENT_AMBULATORY_CARE_PROVIDER_SITE_OTHER): Payer: Medicaid Other | Admitting: Obstetrics & Gynecology

## 2017-03-10 VITALS — BP 114/73 | HR 87 | Wt 199.0 lb

## 2017-03-10 DIAGNOSIS — O09893 Supervision of other high risk pregnancies, third trimester: Secondary | ICD-10-CM

## 2017-03-10 DIAGNOSIS — Z6791 Unspecified blood type, Rh negative: Secondary | ICD-10-CM

## 2017-03-10 DIAGNOSIS — Z029 Encounter for administrative examinations, unspecified: Secondary | ICD-10-CM

## 2017-03-10 DIAGNOSIS — Z3403 Encounter for supervision of normal first pregnancy, third trimester: Secondary | ICD-10-CM

## 2017-03-10 DIAGNOSIS — O26899 Other specified pregnancy related conditions, unspecified trimester: Secondary | ICD-10-CM

## 2017-03-10 DIAGNOSIS — O368130 Decreased fetal movements, third trimester, not applicable or unspecified: Secondary | ICD-10-CM | POA: Diagnosis not present

## 2017-03-10 DIAGNOSIS — D582 Other hemoglobinopathies: Secondary | ICD-10-CM

## 2017-03-10 NOTE — Patient Instructions (Addendum)
Third Trimester of Pregnancy The third trimester is from week 28 through week 40 (months 7 through 9). The third trimester is a time when the unborn baby (fetus) is growing rapidly. At the end of the ninth month, the fetus is about 20 inches in length and weighs 6-10 pounds. Body changes during your third trimester Your body will continue to go through many changes during pregnancy. The changes vary from woman to woman. During the third trimester:  Your weight will continue to increase. You can expect to gain 25-35 pounds (11-16 kg) by the end of the pregnancy.  You may begin to get stretch marks on your hips, abdomen, and breasts.  You may urinate more often because the fetus is moving lower into your pelvis and pressing on your bladder.  You may develop or continue to have heartburn. This is caused by increased hormones that slow down muscles in the digestive tract.  You may develop or continue to have constipation because increased hormones slow digestion and cause the muscles that push waste through your intestines to relax.  You may develop hemorrhoids. These are swollen veins (varicose veins) in the rectum that can itch or be painful.  You may develop swollen, bulging veins (varicose veins) in your legs.  You may have increased body aches in the pelvis, back, or thighs. This is due to weight gain and increased hormones that are relaxing your joints.  You may have changes in your hair. These can include thickening of your hair, rapid growth, and changes in texture. Some women also have hair loss during or after pregnancy, or hair that feels dry or thin. Your hair will most likely return to normal after your baby is born.  Your breasts will continue to grow and they will continue to become tender. A yellow fluid (colostrum) may leak from your breasts. This is the first milk you are producing for your baby.  Your belly button may stick out.  You may notice more swelling in your hands,  face, or ankles.  You may have increased tingling or numbness in your hands, arms, and legs. The skin on your belly may also feel numb.  You may feel short of breath because of your expanding uterus.  You may have more problems sleeping. This can be caused by the size of your belly, increased need to urinate, and an increase in your body's metabolism.  You may notice the fetus "dropping," or moving lower in your abdomen (lightening).  You may have increased vaginal discharge.  You may notice your joints feel loose and you may have pain around your pelvic bone.  What to expect at prenatal visits You will have prenatal exams every 2 weeks until week 36. Then you will have weekly prenatal exams. During a routine prenatal visit:  You will be weighed to make sure you and the baby are growing normally.  Your blood pressure will be taken.  Your abdomen will be measured to track your baby's growth.  The fetal heartbeat will be listened to.  Any test results from the previous visit will be discussed.  You may have a cervical check near your due date to see if your cervix has softened or thinned (effaced).  You will be tested for Group B streptococcus. This happens between 35 and 37 weeks.  Your health care provider may ask you:  What your birth plan is.  How you are feeling.  If you are feeling the baby move.  If you have had   any abnormal symptoms, such as leaking fluid, bleeding, severe headaches, or abdominal cramping.  If you are using any tobacco products, including cigarettes, chewing tobacco, and electronic cigarettes.  If you have any questions.  Other tests or screenings that may be performed during your third trimester include:  Blood tests that check for low iron levels (anemia).  Fetal testing to check the health, activity level, and growth of the fetus. Testing is done if you have certain medical conditions or if there are problems during the  pregnancy.  Nonstress test (NST). This test checks the health of your baby to make sure there are no signs of problems, such as the baby not getting enough oxygen. During this test, a belt is placed around your belly. The baby is made to move, and its heart rate is monitored during movement.  What is false labor? False labor is a condition in which you feel small, irregular tightenings of the muscles in the womb (contractions) that usually go away with rest, changing position, or drinking water. These are called Braxton Hicks contractions. Contractions may last for hours, days, or even weeks before true labor sets in. If contractions come at regular intervals, become more frequent, increase in intensity, or become painful, you should see your health care provider. What are the signs of labor?  Abdominal cramps.  Regular contractions that start at 10 minutes apart and become stronger and more frequent with time.  Contractions that start on the top of the uterus and spread down to the lower abdomen and back.  Increased pelvic pressure and dull back pain.  A watery or bloody mucus discharge that comes from the vagina.  Leaking of amniotic fluid. This is also known as your "water breaking." It could be a slow trickle or a gush. Let your health care provider know if it has a color or strange odor. If you have any of these signs, call your health care provider right away, even if it is before your due date. Follow these instructions at home: Medicines  Follow your health care provider's instructions regarding medicine use. Specific medicines may be either safe or unsafe to take during pregnancy.  Take a prenatal vitamin that contains at least 600 micrograms (mcg) of folic acid.  If you develop constipation, try taking a stool softener if your health care provider approves. Eating and drinking  Eat a balanced diet that includes fresh fruits and vegetables, whole grains, good sources of protein  such as meat, eggs, or tofu, and low-fat dairy. Your health care provider will help you determine the amount of weight gain that is right for you.  Avoid raw meat and uncooked cheese. These carry germs that can cause birth defects in the baby.  If you have low calcium intake from food, talk to your health care provider about whether you should take a daily calcium supplement.  Eat four or five small meals rather than three large meals a day.  Limit foods that are high in fat and processed sugars, such as fried and sweet foods.  To prevent constipation: ? Drink enough fluid to keep your urine clear or pale yellow. ? Eat foods that are high in fiber, such as fresh fruits and vegetables, whole grains, and beans. Activity  Exercise only as directed by your health care provider. Most women can continue their usual exercise routine during pregnancy. Try to exercise for 30 minutes at least 5 days a week. Stop exercising if you experience uterine contractions.  Avoid heavy   lifting.  Do not exercise in extreme heat or humidity, or at high altitudes.  Wear low-heel, comfortable shoes.  Practice good posture.  You may continue to have sex unless your health care provider tells you otherwise. Relieving pain and discomfort  Take frequent breaks and rest with your legs elevated if you have leg cramps or low back pain.  Take warm sitz baths to soothe any pain or discomfort caused by hemorrhoids. Use hemorrhoid cream if your health care provider approves.  Wear a good support bra to prevent discomfort from breast tenderness.  If you develop varicose veins: ? Wear support pantyhose or compression stockings as told by your healthcare provider. ? Elevate your feet for 15 minutes, 3-4 times a day. Prenatal care  Write down your questions. Take them to your prenatal visits.  Keep all your prenatal visits as told by your health care provider. This is important. Safety  Wear your seat belt at  all times when driving.  Make a list of emergency phone numbers, including numbers for family, friends, the hospital, and police and fire departments. General instructions  Avoid cat litter boxes and soil used by cats. These carry germs that can cause birth defects in the baby. If you have a cat, ask someone to clean the litter box for you.  Do not travel far distances unless it is absolutely necessary and only with the approval of your health care provider.  Do not use hot tubs, steam rooms, or saunas.  Do not drink alcohol.  Do not use any products that contain nicotine or tobacco, such as cigarettes and e-cigarettes. If you need help quitting, ask your health care provider.  Do not use any medicinal herbs or unprescribed drugs. These chemicals affect the formation and growth of the baby.  Do not douche or use tampons or scented sanitary pads.  Do not cross your legs for long periods of time.  To prepare for the arrival of your baby: ? Take prenatal classes to understand, practice, and ask questions about labor and delivery. ? Make a trial run to the hospital. ? Visit the hospital and tour the maternity area. ? Arrange for maternity or paternity leave through employers. ? Arrange for family and friends to take care of pets while you are in the hospital. ? Purchase a rear-facing car seat and make sure you know how to install it in your car. ? Pack your hospital bag. ? Prepare the baby's nursery. Make sure to remove all pillows and stuffed animals from the baby's crib to prevent suffocation.  Visit your dentist if you have not gone during your pregnancy. Use a soft toothbrush to brush your teeth and be gentle when you floss. Contact a health care provider if:  You are unsure if you are in labor or if your water has broken.  You become dizzy.  You have mild pelvic cramps, pelvic pressure, or nagging pain in your abdominal area.  You have lower back pain.  You have persistent  nausea, vomiting, or diarrhea.  You have an unusual or bad smelling vaginal discharge.  You have pain when you urinate. Get help right away if:  Your water breaks before 37 weeks.  You have regular contractions less than 5 minutes apart before 37 weeks.  You have a fever.  You are leaking fluid from your vagina.  You have spotting or bleeding from your vagina.  You have severe abdominal pain or cramping.  You have rapid weight loss or weight gain.    You have shortness of breath with chest pain.  You notice sudden or extreme swelling of your face, hands, ankles, feet, or legs.  Your baby makes fewer than 10 movements in 2 hours.  You have severe headaches that do not go away when you take medicine.  You have vision changes. Summary  The third trimester is from week 28 through week 40, months 7 through 9. The third trimester is a time when the unborn baby (fetus) is growing rapidly.  During the third trimester, your discomfort may increase as you and your baby continue to gain weight. You may have abdominal, leg, and back pain, sleeping problems, and an increased need to urinate.  During the third trimester your breasts will keep growing and they will continue to become tender. A yellow fluid (colostrum) may leak from your breasts. This is the first milk you are producing for your baby.  False labor is a condition in which you feel small, irregular tightenings of the muscles in the womb (contractions) that eventually go away. These are called Braxton Hicks contractions. Contractions may last for hours, days, or even weeks before true labor sets in.  Signs of labor can include: abdominal cramps; regular contractions that start at 10 minutes apart and become stronger and more frequent with time; watery or bloody mucus discharge that comes from the vagina; increased pelvic pressure and dull back pain; and leaking of amniotic fluid. This information is not intended to replace advice  given to you by your health care provider. Make sure you discuss any questions you have with your health care provider. Document Released: 09/01/2001 Document Revised: 02/13/2016 Document Reviewed: 11/08/2012 Elsevier Interactive Patient Education  2017 [employer] Fetal Movement Counts Patient Name: ________________________________________________ Patient Due Date: ____________________ What is a fetal movement count? A fetal movement count is the number of times that you feel your baby move during a certain amount of time. This may also be called a fetal kick count. A fetal movement count is recommended for every pregnant woman. You may be asked to start counting fetal movements as early as week 28 of your pregnancy. Pay attention to when your baby is most active. You may notice your baby's sleep and wake cycles. You may also notice things that make your baby move more. You should do a fetal movement count:  When your baby is normally most active.  At the same time each day.  A good time to count movements is while you are resting, after having something to eat and drink. How do I count fetal movements? 1. Find a quiet, comfortable area. Sit, or lie down on your side. 2. Write down the date, the start time and stop time, and the number of movements that you felt between those two times. Take this information with you to your health care visits. 3. For 2 hours, count kicks, flutters, swishes, rolls, and jabs. You should feel at least 10 movements during 2 hours. 4. You may stop counting after you have felt 10 movements. 5. If you do not feel 10 movements in 2 hours, have something to eat and drink. Then, keep resting and counting for 1 hour. If you feel at least 4 movements during that hour, you may stop counting. Contact a health care provider if:  You feel fewer than 4 movements in 2 hours.  Your baby is not moving like he or she usually does. Date: ____________ Start time: ____________  Stop time: ____________ Movements: ____________ Date: ____________ Start time:   ____________ Stop time: ____________ Movements: ____________ Date: ____________ Start time: ____________ Stop time: ____________ Movements: ____________ Date: ____________ Start time: ____________ Stop time: ____________ Movements: ____________ Date: ____________ Start time: ____________ Stop time: ____________ Movements: ____________ Date: ____________ Start time: ____________ Stop time: ____________ Movements: ____________ Date: ____________ Start time: ____________ Stop time: ____________ Movements: ____________ Date: ____________ Start time: ____________ Stop time: ____________ Movements: ____________ Date: ____________ Start time: ____________ Stop time: ____________ Movements: ____________ This information is not intended to replace advice given to you by your health care provider. Make sure you discuss any questions you have with your health care provider. Document Released: 10/07/2006 Document Revised: 05/06/2016 Document Reviewed: 10/17/2015 Elsevier Interactive Patient Education  2018 [employer] Inc[employer]  

## 2017-03-10 NOTE — Progress Notes (Signed)
   PRENATAL VISIT NOTE  Subjective:  Vicki SpellerJasmine Mcdonald is a 21 y.o. G3P1011 at 6515w3d being seen today for ongoing prenatal care.  She is currently monitored for the following issues for this low-risk pregnancy and has Hemoglobin C trait (HCC); Rh negative state in antepartum period; and Supervision of normal pregnancy on her problem list.  Patient reports pt reports decreased fetal movement.  Contractions: Irritability. Vag. Bleeding: None.  Movement: Present. Denies leaking of fluid.   The following portions of the patient's history were reviewed and updated as appropriate: allergies, current medications, past family history, past medical history, past social history, past surgical history and problem list. Problem list updated.  Objective:   Vitals:   03/10/17 1407  BP: 114/73  Pulse: 87  Weight: 199 lb (90.3 kg)    Fetal Status:     Movement: Present     General:  Alert, oriented and cooperative. Patient is in no acute distress.  Skin: Skin is warm and dry. No rash noted.   Cardiovascular: Normal heart rate noted  Respiratory: Normal respiratory effort, no problems with respiration noted  Abdomen: Soft, gravid, appropriate for gestational age. Pain/Pressure: Absent     Pelvic:  Cervical exam performed        Extremities: Normal range of motion.  Edema: Trace  Mental Status: Normal mood and affect. Normal behavior. Normal judgment and thought content.   Assessment and Plan:  Pregnancy: G3P1011 at 3715w3d  1. Encounter for supervision of normal first pregnancy in third trimester Plans for water birth  2. Rh negative state in antepartum period Pt and husband are rh neg- they have declined Rhogam  3. Hemoglobin C trait (HCC)  4. decreased fetal movement NST reviewed and reactive. [facility] and gave handout on how to count movements  Term labor symptoms and general obstetric precautions including but not limited to vaginal bleeding, contractions, leaking of fluid and fetal  movement were reviewed in detail with the patient. Please refer to After Visit Summary for other counseling recommendations.  Return in about 1 week (around 03/17/2017).   Total face-to-face time with patient was 15 min.  Greater than 50% was spent in counseling and coordination of care with the patient.   Willodean Rosenthalarolyn Harraway-Smith, MD

## 2017-03-11 ENCOUNTER — Inpatient Hospital Stay (HOSPITAL_COMMUNITY): Payer: Medicaid Other | Admitting: Anesthesiology

## 2017-03-11 ENCOUNTER — Inpatient Hospital Stay (HOSPITAL_COMMUNITY)
Admission: AD | Admit: 2017-03-11 | Discharge: 2017-03-12 | DRG: 775 | Disposition: A | Discharge status: Home / Self Care | Payer: Medicaid Other | Source: Ambulatory Visit | Attending: Obstetrics and Gynecology | Admitting: Obstetrics and Gynecology

## 2017-03-11 ENCOUNTER — Encounter (HOSPITAL_COMMUNITY): Payer: Self-pay

## 2017-03-11 DIAGNOSIS — Z6791 Unspecified blood type, Rh negative: Secondary | ICD-10-CM

## 2017-03-11 DIAGNOSIS — Z3A38 38 weeks gestation of pregnancy: Secondary | ICD-10-CM | POA: Diagnosis not present

## 2017-03-11 DIAGNOSIS — Z3403 Encounter for supervision of normal first pregnancy, third trimester: Secondary | ICD-10-CM

## 2017-03-11 DIAGNOSIS — O26893 Other specified pregnancy related conditions, third trimester: Secondary | ICD-10-CM | POA: Diagnosis present

## 2017-03-11 DIAGNOSIS — Z3493 Encounter for supervision of normal pregnancy, unspecified, third trimester: Secondary | ICD-10-CM | POA: Diagnosis present

## 2017-03-11 LAB — CBC
HCT: 31.5 % — ABNORMAL LOW (ref 36.0–46.0)
Hemoglobin: 11.2 g/dL — ABNORMAL LOW (ref 12.0–15.0)
MCH: 28.9 pg (ref 26.0–34.0)
MCHC: 35.6 g/dL (ref 30.0–36.0)
MCV: 81.4 fL (ref 78.0–100.0)
PLATELETS: 181 10*3/uL (ref 150–400)
RBC: 3.87 MIL/uL (ref 3.87–5.11)
RDW: 13.7 % (ref 11.5–15.5)
WBC: 13.8 10*3/uL — AB (ref 4.0–10.5)

## 2017-03-11 LAB — TYPE AND SCREEN
ABO/RH(D): A NEG
ANTIBODY SCREEN: NEGATIVE

## 2017-03-11 LAB — RPR: RPR: NONREACTIVE

## 2017-03-11 MED ORDER — DIPHENHYDRAMINE HCL 25 MG PO CAPS: 25.0000 mg | ORAL_CAPSULE | Freq: Four times a day (QID) | ORAL | Status: DC | PRN

## 2017-03-11 MED ORDER — OXYCODONE HCL 5 MG PO TABS
5.0000 mg | ORAL_TABLET | ORAL | Status: DC | PRN
  Administered 2017-03-11 – 2017-03-12 (×2): 5 mg via ORAL
  Filled 2017-03-11 (×2): qty 1

## 2017-03-11 MED ORDER — LIDOCAINE HCL (PF) 1 % IJ SOLN
30.0000 mL | INTRAMUSCULAR | Status: DC | PRN
  Filled 2017-03-11: qty 30

## 2017-03-11 MED ORDER — WITCH HAZEL-GLYCERIN EX PADS: 1.0000 "application " | MEDICATED_PAD | CUTANEOUS | Status: DC | PRN

## 2017-03-11 MED ORDER — OXYTOCIN BOLUS FROM INFUSION
500.0000 mL | Freq: Once | INTRAVENOUS | Status: AC
  Administered 2017-03-11: 500 mL via INTRAVENOUS

## 2017-03-11 MED ORDER — FENTANYL 2.5 MCG/ML BUPIVACAINE 1/10 % EPIDURAL INFUSION (WH - ANES)
INTRAMUSCULAR | Status: AC
  Filled 2017-03-11: qty 100

## 2017-03-11 MED ORDER — LACTATED RINGERS IV SOLN: 500.0000 mL | Freq: Once | INTRAVENOUS | Status: DC

## 2017-03-11 MED ORDER — COCONUT OIL OIL
1.0000 "application " | TOPICAL_OIL | Status: DC | PRN
  Filled 2017-03-11: qty 120

## 2017-03-11 MED ORDER — OXYTOCIN 40 UNITS IN LACTATED RINGERS INFUSION - SIMPLE MED
2.5000 [IU]/h | INTRAVENOUS | Status: DC
  Administered 2017-03-11: 2.5 [IU]/h via INTRAVENOUS
  Filled 2017-03-11: qty 1000

## 2017-03-11 MED ORDER — PHENYLEPHRINE 40 MCG/ML (10ML) SYRINGE FOR IV PUSH (FOR BLOOD PRESSURE SUPPORT)
PREFILLED_SYRINGE | INTRAVENOUS | Status: AC
  Filled 2017-03-11: qty 20

## 2017-03-11 MED ORDER — SOD CITRATE-CITRIC ACID 500-334 MG/5ML PO SOLN: 30.0000 mL | ORAL | Status: DC | PRN

## 2017-03-11 MED ORDER — ZOLPIDEM TARTRATE 5 MG PO TABS: 5.0000 mg | ORAL_TABLET | Freq: Every evening | ORAL | Status: DC | PRN

## 2017-03-11 MED ORDER — TETANUS-DIPHTH-ACELL PERTUSSIS 5-2.5-18.5 LF-MCG/0.5 IM SUSP: 0.5000 mL | Freq: Once | INTRAMUSCULAR | Status: DC

## 2017-03-11 MED ORDER — EPHEDRINE 5 MG/ML INJ
10.0000 mg | INTRAVENOUS | Status: DC | PRN
  Filled 2017-03-11: qty 2

## 2017-03-11 MED ORDER — BENZOCAINE-MENTHOL 20-0.5 % EX AERO
1.0000 "application " | INHALATION_SPRAY | CUTANEOUS | Status: DC | PRN
  Administered 2017-03-11: 1 via TOPICAL
  Filled 2017-03-11: qty 56

## 2017-03-11 MED ORDER — DIPHENHYDRAMINE HCL 50 MG/ML IJ SOLN: 12.5000 mg | INTRAMUSCULAR | Status: DC | PRN

## 2017-03-11 MED ORDER — IBUPROFEN 600 MG PO TABS
600.0000 mg | ORAL_TABLET | Freq: Four times a day (QID) | ORAL | Status: DC
Start: 2017-03-11 — End: 2017-03-12
  Administered 2017-03-11 – 2017-03-12 (×5): 600 mg via ORAL
  Filled 2017-03-11 (×5): qty 1

## 2017-03-11 MED ORDER — PHENYLEPHRINE 40 MCG/ML (10ML) SYRINGE FOR IV PUSH (FOR BLOOD PRESSURE SUPPORT)
80.0000 ug | PREFILLED_SYRINGE | INTRAVENOUS | Status: DC | PRN
  Filled 2017-03-11: qty 5

## 2017-03-11 MED ORDER — ACETAMINOPHEN 325 MG PO TABS: 650.0000 mg | ORAL_TABLET | ORAL | Status: DC | PRN

## 2017-03-11 MED ORDER — LACTATED RINGERS IV SOLN: 500.0000 mL | INTRAVENOUS | Status: DC | PRN

## 2017-03-11 MED ORDER — FENTANYL CITRATE (PF) 100 MCG/2ML IJ SOLN
INTRAMUSCULAR | Status: AC
  Administered 2017-03-11: 100 ug via INTRAVENOUS
  Filled 2017-03-11: qty 2

## 2017-03-11 MED ORDER — OXYCODONE-ACETAMINOPHEN 5-325 MG PO TABS: 2.0000 | ORAL_TABLET | ORAL | Status: DC | PRN

## 2017-03-11 MED ORDER — DIBUCAINE 1 % RE OINT: 1.0000 "application " | TOPICAL_OINTMENT | RECTAL | Status: DC | PRN

## 2017-03-11 MED ORDER — FENTANYL CITRATE (PF) 100 MCG/2ML IJ SOLN
100.0000 ug | INTRAMUSCULAR | Status: DC | PRN
  Administered 2017-03-11: 100 ug via INTRAVENOUS

## 2017-03-11 MED ORDER — ONDANSETRON HCL 4 MG/2ML IJ SOLN: 4.0000 mg | Freq: Four times a day (QID) | INTRAMUSCULAR | Status: DC | PRN

## 2017-03-11 MED ORDER — LIDOCAINE HCL (PF) 1 % IJ SOLN
INTRAMUSCULAR | Status: DC | PRN
  Administered 2017-03-11: 4 mL
  Administered 2017-03-11: 6 mL via EPIDURAL

## 2017-03-11 MED ORDER — PRENATAL MULTIVITAMIN CH
1.0000 | ORAL_TABLET | Freq: Every day | ORAL | Status: DC
  Administered 2017-03-11 – 2017-03-12 (×2): 1 via ORAL
  Filled 2017-03-11 (×2): qty 1

## 2017-03-11 MED ORDER — ONDANSETRON HCL 4 MG/2ML IJ SOLN: 4.0000 mg | INTRAMUSCULAR | Status: DC | PRN

## 2017-03-11 MED ORDER — LACTATED RINGERS IV SOLN
INTRAVENOUS | Status: DC
  Administered 2017-03-11: 07:00:00 via INTRAVENOUS

## 2017-03-11 MED ORDER — ACETAMINOPHEN 325 MG PO TABS
650.0000 mg | ORAL_TABLET | ORAL | Status: DC | PRN
  Administered 2017-03-11 (×2): 650 mg via ORAL
  Filled 2017-03-11 (×2): qty 2

## 2017-03-11 MED ORDER — OXYCODONE-ACETAMINOPHEN 5-325 MG PO TABS: 1.0000 | ORAL_TABLET | ORAL | Status: DC | PRN

## 2017-03-11 MED ORDER — FENTANYL 2.5 MCG/ML BUPIVACAINE 1/10 % EPIDURAL INFUSION (WH - ANES)
14.0000 mL/h | INTRAMUSCULAR | Status: DC | PRN
  Administered 2017-03-11: 14 mL/h via EPIDURAL
  Filled 2017-03-11: qty 100

## 2017-03-11 MED ORDER — SIMETHICONE 80 MG PO CHEW: 80.0000 mg | CHEWABLE_TABLET | ORAL | Status: DC | PRN

## 2017-03-11 MED ORDER — ONDANSETRON HCL 4 MG PO TABS: 4.0000 mg | ORAL_TABLET | ORAL | Status: DC | PRN

## 2017-03-11 MED ORDER — SENNOSIDES-DOCUSATE SODIUM 8.6-50 MG PO TABS
2.0000 | ORAL_TABLET | ORAL | Status: DC
  Administered 2017-03-12: 2 via ORAL
  Filled 2017-03-11: qty 2

## 2017-03-11 NOTE — Progress Notes (Signed)
Patient ID: Leta SpellerJasmine Mcdonald, female   DOB: 05/20/1996, 21 y.o.   MRN: [id number]  Comfortable w/ epidural VSS, afeb FHR 130s, +accels, no decels Ctx q 2 mins, spont Cx last exam 5+; having mod amt vag bldg-stable  IUP@term  Early active labor  Plan cx recheck within the hour Anticipate SVD Watch vag bldg, but not concerned currently  Cam HaiSHAW, KIMBERLY [facility] 03/11/2017 7:37 AM

## 2017-03-11 NOTE — Anesthesia Preprocedure Evaluation (Signed)

## 2017-03-11 NOTE — Progress Notes (Signed)
MOB was referred for history of depression/anxiety. * Referral screened out by Clinical Social Worker because none of the following criteria appear to apply: ~ History of anxiety/depression during this pregnancy, or of post-partum depression. ~ Diagnosis of anxiety and/or depression within last 3 years OR * MOB's symptoms currently being treated with medication and/or therapy. Please contact the Clinical Social Worker if needs arise, or if MOB requests.   

## 2017-03-11 NOTE — Lactation Note (Signed)
This note was copied from a baby's chart. Lactation Consultation Note  Patient Name: Vicki Leta SpellerJasmine Bigler UJWJX'BToday's Date: 03/11/2017 Reason for consult: Initial assessment Baby at 7 hr of life. Mom reports baby is latching well, she can hear swallows. She denies breast or nipple pain. Discussed baby behavior, feeding frequency, baby belly size, voids, wt loss, breast changes, and nipple care. Mom stated she can manually express and has a spoon in the room. Given lactation handouts. Aware of OP services and support group. Instructed mom to call at the next bf for lactation to observe a latch.     Maternal Data Has patient been taught Hand Expression?: Yes Does the patient have breastfeeding experience prior to this delivery?: Yes  Feeding Feeding Type: Breast Fed Length of feed: 20 min  LATCH Score/Interventions Latch: Grasps breast easily, tongue down, lips flanged, rhythmical sucking.  Audible Swallowing: A few with stimulation  Type of Nipple: Everted at rest and after stimulation  Comfort (Breast/Nipple): Filling, red/small blisters or bruises, mild/mod discomfort  Problem noted: Mild/Moderate discomfort (mother states "feeling raw") Interventions (Mild/moderate discomfort): Hand massage;Hand expression (expressed breast milk to nipples)  Hold (Positioning): No assistance needed to correctly position infant at breast.  LATCH Score: 8  Lactation Tools Discussed/Used WIC Program: No   Consult Status Consult Status: Follow-up Date: 03/12/17 Follow-up type: In-patient    Rulon Eisenmengerlizabeth E Addysen Louth 03/11/2017, 4:47 PM

## 2017-03-11 NOTE — Anesthesia Pain Management Evaluation Note (Signed)
  CRNA Pain Management Visit Note  Patient: Vicki Mcdonald, 21 y.o., female  "[facility] We have an anesthesia team available at all times to provide care throughout the hospital, including epidural management and anesthesia for C-section. I don't know your plan for the delivery whether it a natural birth, water birth, IV sedation, nitrous supplementation, doula or epidural, but we want to meet your pain goals."   1.Was your pain managed to your expectations on prior hospitalizations?   No prior hospitalizations  2.What is your expectation for pain management during this hospitalization?     Epidural  3.How can we help you reach that goal? Epidural intact and working well  Record the patient's initial score and the patient's pain goal.   Pain: 2  Pain Goal: 8 [facility] wants you to be able to say your pain was always managed very well.  Edison PaceWILKERSON,Doniqua Saxby 03/11/2017

## 2017-03-11 NOTE — Plan of Care (Signed)
Problem: Education: Goal: Knowledge of condition will improve Patient complains of nipples feeling "raw." Discussed and demonstrated proper latching and positioning during latch. Discussed and demonstrated hand expression to nipples. Patient had large drops of colostrum after only expressing twice. Encouraged patient to call for coconut oil if EBM did not seem to be helpful. Earl Galasborne, Linda HedgesStefanie WeedHudspeth

## 2017-03-11 NOTE — H&P (Signed)
LABOR AND DELIVERY ADMISSION HISTORY AND PHYSICAL NOTE  Vicki Mcdonald is a 21 y.o. female G3P1011 with IUP at [redacted]w[redacted]d by 11 wk [place] presenting for SOL at 2AM. She reports she thoght she had to have a BM, but just passed some blood into the toilet. Contractions have been increasing in intensity since then.    She reports positive fetal movement. She denies leakage of fluid or vaginal bleeding.  Prenatal History/Complications:  Past Medical History: Past Medical History:  Diagnosis Date  . Anemia   . Anxiety    no meds  . Blood transfusion without reported diagnosis 2014   x3  . Hemoglobin C trait (HCC)   . History of blood transfusion 01/17/2014   X 3 09/2012   . Infection    UTI  . Urinary tract infection     Past Surgical History: Past Surgical History:  Procedure Laterality Date  . NO PAST SURGERIES      Obstetrical History: OB History    Gravida Para Term Preterm AB Living   3 1 1  0 1 1   SAB TAB Ectopic Multiple Live Births   1 0 0 0 1      Social History: Social History   Social History  . Marital status: Married    Spouse name: N/A  . Number of children: N/A  . Years of education: N/A   Social History Main Topics  . Smoking status: Never Smoker  . Smokeless tobacco: Never Used  . Alcohol use No  . Drug use: No  . Sexual activity: Yes    Birth control/ protection: None   Other Topics Concern  . None   Social History Narrative  . None    Family History: Family History  Problem Relation Age of Onset  . Thyroid disease Mother   . Diabetes Maternal Grandmother   . Cancer Maternal Grandmother        cervical  . Cancer Maternal Grandfather   . Hearing loss Neg Hx     Allergies: Allergies  Allergen Reactions  . Diflucan [Fluconazole] Hives    Prescriptions Prior to Admission  Medication Sig Dispense Refill Last Dose  . Prenatal Vit-Fe Fumarate-FA (PRENATAL MULTIVITAMIN) TABS tablet Take 1 tablet by mouth daily at 12 noon. 30 tablet 12  03/10/2017 at Unknown time     Review of Systems   All systems reviewed and negative except as stated in HPI  Blood pressure 128/82, pulse 97, temperature 98.4 F (36.9 C), temperature source Oral, resp. rate 16, last menstrual period 06/14/2016, currently breastfeeding. General appearance: alert, cooperative and appears stated age Lungs: no respiratory distress Heart: regular rate and rhythm Abdomen: soft, non-tender; bowel sounds normal Extremities: No calf swelling or tenderness Presentation: cephalic by nursing exam Fetal monitoring:  Category 1 140/mod var/reactive Uterine activity: contractions q2-3 mintues Dilation: 5 Effacement (%): 90 Station: -2 Exam by:: Genevie Ann, MD    Prenatal labs: ABO, Rh: A/NEG/-- (12/26 1429) Antibody: Negative (04/12 1043) Rubella: immune RPR: Non Reactive (04/12 1042)  HBsAg: NEGATIVE (12/26 1429)  HIV: Non Reactive (04/12 1042)  GBS:   negative 2 hr Glucola: normal Genetic screening:  First trimester screen negative Anatomy US: normal  Prenatal Transfer Tool  Maternal Diabetes: No Genetic Screening: Normal Maternal Ultrasounds/Referrals: Normal Fetal Ultrasounds or other Referrals:  None Maternal Substance Abuse:  No Significant Maternal Medications:  None Significant Maternal Lab Results: Lab values include: Group B Strep negative  No results found for this or any previous visit (from the  past 24 hour(s)).  Patient Active Problem List   Diagnosis Date Noted  . Supervision of normal pregnancy 08/31/2016  . Hemoglobin C trait (HCC) 01/17/2014  . Rh negative state in antepartum period 01/17/2014    Assessment: Vicki Mcdonald is a 21 y.o. G3P1011 at 7853w4d here for SOL around 2AM with some mild bleeding  #Labor: expectant mangement - plans water birth #Pain: Plans for water birth #FWB: Category 1 #ID:  GBS neg #MOF: breast #MOC: IUD   Ernestina Pennaicholas Rubie Ficco 03/11/2017, 4:21 AM

## 2017-03-11 NOTE — MAU Note (Signed)
Patient presents with ctx every 3-4 mins. Patient denies any LOF. Fetus active. GBS -

## 2017-03-11 NOTE — Progress Notes (Signed)
Foley catheter was removed intact at 1023- 200ml emptied. .Marland Kitchen

## 2017-03-11 NOTE — Anesthesia Postprocedure Evaluation (Signed)
Anesthesia Post Note  Patient: Vicki Mcdonald  Procedure(s) Performed: * No procedures listed *     Patient location during evaluation: Mother Baby Anesthesia Type: Epidural Level of consciousness: awake, awake and alert, oriented and patient cooperative Pain management: pain level controlled Vital Signs Assessment: post-procedure vital signs reviewed and stable Respiratory status: spontaneous breathing, nonlabored ventilation and respiratory function stable Cardiovascular status: stable Postop Assessment: no headache, no backache, epidural receding, patient able to bend at knees and no signs of nausea or vomiting Anesthetic complications: no    Last Vitals:  Vitals:   03/11/17 1054 03/11/17 1210  BP: (!) 121/59 130/65  Pulse: 65 74  Resp: 18 20  Temp: 36.8 C     Last Pain:  Vitals:   03/11/17 1210  TempSrc: Oral  PainSc: 0-No pain   Pain Goal: Patients Stated Pain Goal: 0 (03/11/17 0753)               [name] L

## 2017-03-11 NOTE — Anesthesia Procedure Notes (Signed)
Epidural Patient location during procedure: OB  Staffing Anesthesiologist: Yisel Megill  Preanesthetic Checklist Completed: patient identified, site marked, surgical consent, pre-op evaluation, timeout performed, IV checked, risks and benefits discussed and monitors and equipment checked  Epidural Patient position: sitting Prep: site prepped and draped and DuraPrep Patient monitoring: continuous pulse ox and blood pressure Approach: midline Location: L3-L4 Injection technique: LOR air  Needle:  Needle type: Tuohy  Needle gauge: 17 G Needle length: 9 cm and 9 Needle insertion depth: 5 cm cm Catheter type: closed end flexible Catheter size: 19 Gauge Catheter at skin depth: 10 cm Test dose: negative  Assessment Events: blood not aspirated, injection not painful, no injection resistance, negative IV test and no paresthesia  Additional Notes Dosing of Epidural:  1st dose, through catheter .............................................  Xylocaine 40 mg  2nd dose, through catheter, after waiting 3 minutes.........Xylocaine 60 mg    As each dose occurred, patient was free of IV sx; and patient exhibited no evidence of SA injection.  Patient is more comfortable after epidural dosed. Please see RN's note for documentation of vital signs,and FHR which are stable.  Patient reminded not to try to ambulate with numb legs, and that an RN must be present when she attempts to get up.        

## 2017-03-12 ENCOUNTER — Encounter: Payer: Medicaid Other | Admitting: Obstetrics & Gynecology

## 2017-03-12 MED ORDER — IBUPROFEN 600 MG PO TABS: 600.0000 mg | ORAL_TABLET | Freq: Four times a day (QID) | ORAL | 0 refills | Status: DC

## 2017-03-12 NOTE — Progress Notes (Signed)
Patient is Rubella Non immune. VIS sheet given. Patient declines vaccine.

## 2017-03-12 NOTE — Discharge Summary (Signed)
OB Discharge Summary  Patient Name: Vicki SpellerJasmine Mcdonald DOB: 08/01/1996 MRN: [id number]  Date of admission: 03/11/2017 Delivering MD: Howard PouchFENG, LAUREN   Date of discharge: 03/12/2017  Admitting diagnosis: 38 WEEKS BLEEDING CTX Intrauterine pregnancy: 604w4d     Secondary diagnosis:Active Problems:   Normal labor      Discharge diagnosis: Term Pregnancy Delivered                                                                      Complications: None  Hospital course:  Onset of Labor With Vaginal Delivery     21 y.o. yo [id number] at 334w4d was admitted in Active Labor on 03/11/2017. Patient had an uncomplicated labor course as follows:  Membrane Rupture Time/Date: 7:53 AM ,03/11/2017   Intrapartum Procedures: Episiotomy: None [1]                                         Lacerations:  Labial [10]  Patient had a delivery of a Viable infant. 03/11/2017  Information for the patient's newborn:  Vicki DunkerJoyce, Boy Mcdonald [[id number]]  Delivery Method: Vaginal, Spontaneous Delivery (Filed from Delivery Summary)    Pateint had an uncomplicated postpartum course.  She is ambulating, tolerating a regular diet, passing flatus, and urinating well. Patient is discharged home in stable condition on 03/12/17.   Physical exam  Vitals:   03/11/17 1210 03/11/17 1545 03/11/17 2216 03/12/17 0500  BP: 130/65 (!) 112/57 (!) 115/56 (!) 104/55  Pulse: 74 78 86 62  Resp: 20 18 16 16   Temp: 98 F (36.7 C) 97.7 F (36.5 C) 98.4 F (36.9 C) 98.2 F (36.8 C)  TempSrc: Oral Oral Oral Oral  SpO2:      Weight:      Height:       General: alert Lochia: appropriate Uterine Fundus: firm Incision: N/A DVT Evaluation: No evidence of DVT seen on physical exam. Labs: Lab Results  Component Value Date   WBC 13.8 (H) 03/11/2017   HGB 11.2 (L) 03/11/2017   HCT 31.5 (L) 03/11/2017   MCV 81.4 03/11/2017   PLT 181 03/11/2017   No flowsheet data found.  Discharge instruction: per After Visit Summary and "Baby and Me  Booklet".  After Visit Meds:  Allergies as of 03/12/2017      Reactions   Diflucan [fluconazole] Hives      Medication List    TAKE these medications   ibuprofen 600 MG tablet Commonly known as:  ADVIL,MOTRIN Take 1 tablet (600 mg total) by mouth every 6 (six) hours.   prenatal multivitamin Tabs tablet Take 1 tablet by mouth daily at 12 noon.       Diet: routine diet  Activity: Advance as tolerated. Pelvic rest for 6 weeks.   Outpatient follow up:4 weeks Follow up Appt:Future Appointments Date Time Provider Department Center  03/18/2017 10:00 AM Levie HeritageStinson, Jacob J, DO [facility] None   Follow up visit: No Follow-up on file.  Postpartum contraception: Plans IUD at postpartum visit  Newborn Data: Live born female  Birth Weight: 7 lb 6.9 oz (3370 g) APGAR: 9, 9  Baby Feeding: Breast Disposition:home with mother  03/12/2017 Vicki Bossier, MD

## 2017-03-12 NOTE — Discharge Instructions (Signed)

## 2017-03-12 NOTE — Lactation Note (Signed)
This note was copied from a baby's chart. Lactation Consultation Note  Patient Name: Vicki Mcdonald Reason for consult: Follow-up assessment;Breast/nipple pain;Infant weight loss (5% weight loss ) Baby is 26 hours old, per mom baby just finished breast feeding 20 mins.  Per mom having nipple soreness, with moms permission assessed breast tissue, no breakdown,  Just some bruising on the left nipple, areola with some edema.  LC recommended prior to latch - breast massage, hand express, pre-pump if needed , latch with breast compressions Until swallows and comfort obtained.rotate between at least 2 breast positions.  Sore nipple and engorgement prevention  and tx reviewed . Instructed on use shells, comfort gels, and per mom  Has a hand pump, and DEBP.  Baby awake , LC changed a large wet diaper prior to feeding and afterwards,  LC assisted mom to latch the baby in cross cradle, and worked on depth, initially mom had discomfort - scale 2-3 and improved quickly. Baby  Latched with depth , LC eased chin down, multiple swallows and fed for 28 mins. Nipple well rounded  When baby came off.  Per mom felt better about feeding.  Mother informed of post-discharge support and given phone number to the lactation department, including services for phone call assistance; out-patient appointments; and breastfeeding support group. List of other breastfeeding resources in the community given in the handout. Encouraged mother to call for problems or concerns related to breastfeeding.  Maternal Data    Feeding Feeding Type: Breast Fed Length of feed:  (multiple swallows / increased with breast compressions )  LATCH Score/Interventions Latch: Grasps breast easily, tongue down, lips flanged, rhythmical sucking.  Audible Swallowing: Spontaneous and intermittent  Type of Nipple: Everted at rest and after stimulation  Comfort (Breast/Nipple): Filling, red/small blisters or bruises,  mild/mod discomfort     Hold (Positioning): Assistance needed to correctly position infant at breast and maintain latch.  LATCH Score: 8  Lactation Tools Discussed/Used WIC Program: No Initiated by:: MAI  Date initiated:: 03/12/17   Consult Status Consult Status: Complete Date: 03/12/17    Kathrin GreathouseMargaret Ann Delaine Hernandez Mcdonald, 11:05 AM

## 2017-03-18 ENCOUNTER — Encounter: Payer: Medicaid Other | Admitting: Family Medicine

## 2017-04-19 ENCOUNTER — Ambulatory Visit: Payer: Medicaid Other | Admitting: Obstetrics & Gynecology

## 2017-04-21 ENCOUNTER — Ambulatory Visit (INDEPENDENT_AMBULATORY_CARE_PROVIDER_SITE_OTHER): Payer: Medicaid Other | Admitting: Obstetrics & Gynecology

## 2017-04-21 ENCOUNTER — Encounter: Payer: Self-pay | Admitting: Obstetrics & Gynecology

## 2017-04-21 VITALS — Ht 66.0 in | Wt 186.0 lb

## 2017-04-21 DIAGNOSIS — F418 Other specified anxiety disorders: Secondary | ICD-10-CM

## 2017-04-21 DIAGNOSIS — Z3202 Encounter for pregnancy test, result negative: Secondary | ICD-10-CM | POA: Diagnosis not present

## 2017-04-21 DIAGNOSIS — Z01812 Encounter for preprocedural laboratory examination: Secondary | ICD-10-CM

## 2017-04-21 DIAGNOSIS — Z30430 Encounter for insertion of intrauterine contraceptive device: Secondary | ICD-10-CM | POA: Diagnosis not present

## 2017-04-21 DIAGNOSIS — Z1389 Encounter for screening for other disorder: Secondary | ICD-10-CM | POA: Diagnosis not present

## 2017-04-21 DIAGNOSIS — O99345 Other mental disorders complicating the puerperium: Secondary | ICD-10-CM

## 2017-04-21 LAB — POCT URINE PREGNANCY: Preg Test, Ur: NEGATIVE

## 2017-04-21 NOTE — Progress Notes (Signed)
Post Partum Exam  Vicki SpellerJasmine Mcdonald is a 21 y.o. [phone] female who presents for a postpartum visit. She is 5 weeks postpartum following a spontaneous vaginal delivery. I have fully reviewed the prenatal and intrapartum course. The delivery was at 38 gestational weeks.  Anesthesia: epidural. Postpartum course has been uneventful. Baby's course has been uneventful. Baby is feeding by breast. Bleeding no bleeding. Bowel function is normal. Bladder function is normal. Patient is sexually active. Contraception method is IUD. Postpartum depression screening:SCORE9  The following portions of the patient's history were reviewed and updated as appropriate: allergies, current medications, past family history, past medical history, past social history, past surgical history and problem list.  Review of Systems Pertinent items are noted in HPI.    Objective:  Height 5\' 6"  (1.676 m), weight 186 lb (84.4 kg), last menstrual period 04/14/2017, unknown if currently breastfeeding.  General:  alert   Breasts:  inspection negative, no nipple discharge or bleeding, no masses or nodularity palpable  Lungs: clear to auscultation bilaterally  Heart:  regular rate and rhythm, S1, S2 normal, no murmur, click, rub or gallop  Abdomen: soft, non-tender; bowel sounds normal; no masses,  no organomegaly   Vulva:  normal  Vagina: normal vagina  Cervix:  anteverted  Corpus: normal  Adnexa:  no mass, fullness, tenderness  Rectal Exam: Not performed.       UPT negative, consent signed, Time out procedure done. Cervix prepped with betadine and grasped with a single tooth tenaculum. Liletta was easily placed and the strings were cut to 3-4 cm. Uterus sounded to 9 cm. She tolerated the procedure well.   Assessment:   Normal postpartum exam. Pap smear not done at today's visit.   Plan:   1. Contraception: IUD 2. She is feeling some anxiety and will be seeing Jaimie in the near future. 3. Follow up in: 1 year or as  needed.

## 2017-04-22 ENCOUNTER — Ambulatory Visit (INDEPENDENT_AMBULATORY_CARE_PROVIDER_SITE_OTHER): Payer: Medicaid Other | Admitting: Clinical

## 2017-04-22 DIAGNOSIS — F4322 Adjustment disorder with anxiety: Secondary | ICD-10-CM

## 2017-04-22 DIAGNOSIS — O99345 Other mental disorders complicating the puerperium: Secondary | ICD-10-CM

## 2017-04-22 DIAGNOSIS — F411 Generalized anxiety disorder: Secondary | ICD-10-CM | POA: Diagnosis not present

## 2017-04-22 DIAGNOSIS — F418 Other specified anxiety disorders: Secondary | ICD-10-CM

## 2017-04-22 NOTE — BH Specialist Note (Signed)
Integrated Behavioral Health Initial Visit  MRN: [id number] Name: Leta SpellerJasmine Mcalpine   Session Start time: 10:15 Session End time: 11:15 Total time: 1 hour  Type of Service: Integrated Behavioral Health- Individual/Family Interpretor:No. Interpretor Name and Language: n/a   Warm Hand Off Completed.       SUBJECTIVE: Leta SpellerJasmine Kalis is a 21 y.o. female accompanied by patient. Patient was referred by Dr Marice Potterove for postpartum anxiety. Patient reports the following symptoms/concerns: Pt states her primary concern is increased feelings of anxiety postpartum; open to learning strategy to cope with anxiety prior to returning to work.  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, and they will work with her regarding breastfeeding and pumping milk)  Self-Care: Works out at gym several days/week, sleeps 7 hours/night  Life Changes: Recent childbirth, stressful labor(heavy bleeding), health issues in newborn(possible seizure, heart murmur)  GOALS ADDRESSED: Patient will reduce symptoms of: anxiety and increase knowledge and/or ability of: self-management skills and also: Increase healthy adjustment to current life circumstances   INTERVENTIONS: Solution-Focused Strategies, Mindfulness or Relaxation Training and Psychoeducation and/or Health Education  Standardized Assessments completed: PHQ 2/ did not do full assessment, as baby was crying and nursing  ASSESSMENT: Patient currently experiencing Adjustment disorder with anxious mood. Patient may benefit from psychoeducation and brief therapeutic interventions regarding coping with symptoms of anxiety.  PLAN: 1. Follow up with behavioral health  clinician on : One month, or as needed 2. Behavioral recommendations:  -CALM relaxation breathing exercise before work daily -Use Worry hour strategy to prioritize daily life stress and worries -Consider Mom Talk and Baby and Me mom support groups [facility], as work schedule allows -Read educational material regarding coping with symptoms of anxiety  3. Referral(s): Integrated Behavioral Health Services (In Clinic) 4. "From scale of 1-10, how likely are you to follow plan?": 8  Rae LipsJamie C Tyshea Imel, LCSWA   Depression screen [facility] 2/[facility] 04/22/2017  Decreased Interest 1  Down, Depressed, Hopeless 1  PHQ - 2 Score 2     Outpatient Encounter Prescriptions as of 04/22/2017  Medication Sig  . ibuprofen (ADVIL,MOTRIN) 600 MG tablet Take 1 tablet (600 mg total) by mouth every 6 (six) hours. (Patient not taking: Reported on 04/21/2017)  . Prenatal Vit-Fe Fumarate-FA (PRENATAL MULTIVITAMIN) TABS tablet Take 1 tablet by mouth daily at 12 noon. (Patient not taking: Reported on 04/21/2017)   No facility-administered encounter medications on file as of 04/22/2017.

## 2017-10-28 ENCOUNTER — Ambulatory Visit: Payer: PRIVATE HEALTH INSURANCE | Admitting: Family Medicine

## 2017-10-28 ENCOUNTER — Encounter: Payer: Self-pay | Admitting: Family Medicine

## 2017-10-28 VITALS — BP 108/72 | HR 99 | Temp 98.6°F | Ht 66.0 in | Wt 177.5 lb

## 2017-10-28 DIAGNOSIS — Z111 Encounter for screening for respiratory tuberculosis: Secondary | ICD-10-CM

## 2017-10-28 DIAGNOSIS — Z0000 Encounter for general adult medical examination without abnormal findings: Secondary | ICD-10-CM | POA: Diagnosis not present

## 2017-10-28 DIAGNOSIS — Z0184 Encounter for antibody response examination: Secondary | ICD-10-CM | POA: Diagnosis not present

## 2017-10-28 DIAGNOSIS — Z23 Encounter for immunization: Secondary | ICD-10-CM | POA: Diagnosis not present

## 2017-10-28 LAB — CBC
HCT: 37.9 % (ref 36.0–46.0)
Hemoglobin: 13.2 g/dL (ref 12.0–15.0)
MCHC: 34.9 g/dL (ref 30.0–36.0)
MCV: 86.6 fl (ref 78.0–100.0)
PLATELETS: 217 10*3/uL (ref 150.0–400.0)
RBC: 4.38 Mil/uL (ref 3.87–5.11)
RDW: 12.8 % (ref 11.5–15.5)
WBC: 7 10*3/uL (ref 4.0–10.5)

## 2017-10-28 LAB — COMPREHENSIVE METABOLIC PANEL
ALBUMIN: 4.2 g/dL (ref 3.5–5.2)
ALK PHOS: 60 U/L (ref 39–117)
ALT: 18 U/L (ref 0–35)
AST: 20 U/L (ref 0–37)
BILIRUBIN TOTAL: 0.8 mg/dL (ref 0.2–1.2)
BUN: 16 mg/dL (ref 6–23)
CO2: 29 mEq/L (ref 19–32)
Calcium: 9.6 mg/dL (ref 8.4–10.5)
Chloride: 104 mEq/L (ref 96–112)
Creatinine, Ser: 0.7 mg/dL (ref 0.40–1.20)
GFR: 110.69 mL/min (ref >60.00)
GLUCOSE: 83 mg/dL (ref 70–99)
Potassium: 4.8 mEq/L (ref 3.5–5.1)
Sodium: 139 mEq/L (ref 135–145)
TOTAL PROTEIN: 8.1 g/dL (ref 6.0–8.3)

## 2017-10-28 LAB — LIPID PANEL
CHOLESTEROL: 119 mg/dL (ref 0–200)
HDL: 33.6 mg/dL — AB (ref >39.00)
LDL Cholesterol: 79 mg/dL (ref 0–99)
NonHDL: 85.33
TRIGLYCERIDES: 33 mg/dL (ref 0.0–149.0)
Total CHOL/HDL Ratio: 4
VLDL: 6.6 mg/dL (ref 0.0–40.0)

## 2017-10-28 NOTE — Progress Notes (Signed)
Pre visit review using our clinic review tool, if applicable. No additional management support is needed unless otherwise documented below in the visit note. 

## 2017-10-28 NOTE — Patient Instructions (Addendum)
Keep the diet clean.  Aim to do some physical exertion for 150 minutes per week. This is typically divided into 5 days per week, 30 minutes per day. The activity should be enough to get your heart rate up. Anything is better than nothing if you have time constraints.  Assume no news is good news for labs. The titers take a little longer to come back. We will send you a MyChart message regarding the results when they all come back unless something is abnormal.   Let us know if you need anything.

## 2017-10-28 NOTE — Progress Notes (Signed)
Chief Complaint  Patient presents with  . Establish Care     Well Woman Vicki Mcdonald is here for a complete physical.   Her last physical was >1 year ago.  Current diet: in general, a "healthy" diet. Current exercise: None, physically active at work Weight is stable and she denies daytime fatigue. No LMP recorded. Seatbelt? Yes  Health Maintenance Tetanus- Yes HIV screening- Yes  Past Medical History:  Diagnosis Date  . Anemia   . Anxiety    no meds  . Blood transfusion without reported diagnosis 2014   x3  . Hemoglobin C trait (HCC)   . History of blood transfusion 01/17/2014   X 3 09/2012   . Infection    UTI  . Urinary tract infection      Past Surgical History:  Procedure Laterality Date  . NO PAST SURGERIES      Medications  Takes no medications routinely.  Allergies Allergies  Allergen Reactions  . Diflucan [Fluconazole] Hives    Review of Systems: Constitutional:  no fevers Eye:  no recent significant change in vision Ear/Nose/Mouth/Throat:  Ears:  no tinnitus or vertigo and no recent change in hearing, Nose/Mouth/Throat:  no complaints of nasal congestion, no sore throat Cardiovascular: no chest pain, no palpitations Respiratory:  no cough and no shortness of breath Gastrointestinal:  no abdominal pain, no change in bowel habits, no significant change in appetite, no nausea, vomiting, diarrhea, or constipation and no black or bloody stool GU:  Female: negative for dysuria, frequency, and incontinence; no abnormal bleeding, pelvic pain, or discharge Musculoskeletal/Extremities:  no pain of the joints Integumentary (Skin/Breast):  no abnormal skin lesions reported Neurologic:  no headaches Endocrine:  denies fatigue Hematologic/Lymphatic:  no unexpected weight changes  Exam BP 108/72 (BP Location: Left Arm, Patient Position: Sitting, Cuff Size: Normal)   Pulse 99   Temp 98.6 F (37 C) (Oral)   Ht 5\' 6"  (1.676 m)   Wt 177 lb 8 oz (80.5 kg)    SpO2 97%   BMI 28.65 kg/m  General:  well developed, well nourished, in no apparent distress Skin:  no significant moles, warts, or growths Head:  no masses, lesions, or tenderness Eyes:  pupils equal and round, sclera anicteric without injection Ears:  canals without lesions, TMs shiny without retraction, no obvious effusion, no erythema Nose:  nares patent, septum midline, mucosa normal, and no drainage or sinus tenderness Throat/Pharynx:  lips and gingiva without lesion; tongue and uvula midline; non-inflamed pharynx; no exudates or postnasal drainage Neck: neck supple without adenopathy, thyromegaly, or masses Breasts:  Not done Thorax:  nontender Lungs:  clear to auscultation, breath sounds equal bilaterally, no respiratory distress Cardio:  regular rate and rhythm without murmurs, heart sounds without clicks or rubs, point of maximal impulse normal; no lifts, heaves, or thrills Abdomen:  abdomen soft, nontender; bowel sounds normal; no masses or organomegaly Genital: Defer to GYN Musculoskeletal:  symmetrical muscle groups noted without atrophy or deformity Extremities:  no clubbing, cyanosis, or edema, no deformities, no skin discoloration Neuro:  gait normal; deep tendon reflexes normal and symmetric Psych: well oriented with normal range of affect and appropriate judgment/insight  Assessment and Plan  Well adult exam - Plan: Lipid panel, Comprehensive metabolic panel, CBC  Screening-pulmonary TB - Plan: QuantiFERON-TB Gold Plus  Immunity status testing - Plan: Measles/Mumps/Rubella Immunity, Hepatitis B Surface AntiBODY, Varicella zoster antibody, IgG  Need for influenza vaccination - Plan: Flu Vaccine QUAD 6+ mos PF IM (Fluarix Quad PF)  [name]. Counseled on diet and exercise. Other orders as above.  Will check titers for school. Follow up in 1 yr or prn pending above. The patient voiced understanding and agreement to the plan.  Vicki Rocheicholas Paul CarterWendling,  DO 10/28/17 12:02 PM

## 2017-10-29 LAB — MEASLES/MUMPS/RUBELLA IMMUNITY
Mumps IgG: 100 AU/mL
RUBEOLA IGG: 275 [AU]/ml
Rubella: 1.93 index

## 2017-10-29 LAB — HEPATITIS B SURFACE ANTIBODY,QUALITATIVE: HEP B S AB: NONREACTIVE

## 2017-10-29 LAB — VARICELLA ZOSTER ANTIBODY, IGG: VARICELLA IGG: 191.2 {index}

## 2017-10-30 LAB — QUANTIFERON-TB GOLD PLUS
NIL: 0.05 IU/mL
QuantiFERON-TB Gold Plus: NEGATIVE
TB2-NIL: <0 IU/mL

## 2017-11-11 ENCOUNTER — Ambulatory Visit (INDEPENDENT_AMBULATORY_CARE_PROVIDER_SITE_OTHER): Payer: PRIVATE HEALTH INSURANCE

## 2017-11-11 DIAGNOSIS — Z23 Encounter for immunization: Secondary | ICD-10-CM

## 2017-12-01 ENCOUNTER — Ambulatory Visit (HOSPITAL_BASED_OUTPATIENT_CLINIC_OR_DEPARTMENT_OTHER)
Admission: RE | Admit: 2017-12-01 | Discharge: 2017-12-01 | Disposition: A | Discharge status: Home / Self Care | Payer: No Typology Code available for payment source | Source: Ambulatory Visit | Attending: Hematology & Oncology | Admitting: Hematology & Oncology

## 2017-12-01 ENCOUNTER — Other Ambulatory Visit: Payer: Self-pay | Admitting: Hematology & Oncology

## 2017-12-01 DIAGNOSIS — M7989 Other specified soft tissue disorders: Secondary | ICD-10-CM

## 2017-12-02 ENCOUNTER — Ambulatory Visit: Payer: PRIVATE HEALTH INSURANCE | Admitting: Family Medicine

## 2017-12-09 ENCOUNTER — Ambulatory Visit (INDEPENDENT_AMBULATORY_CARE_PROVIDER_SITE_OTHER): Payer: PRIVATE HEALTH INSURANCE

## 2017-12-09 DIAGNOSIS — Z23 Encounter for immunization: Secondary | ICD-10-CM

## 2017-12-09 NOTE — Progress Notes (Signed)
Patient came in to have her 2nd Hepatitis B vaccine.  She tolerated 1020mcg/mL in her right arm.    She has been advised to return in August for her 3rd and final Hepatitis B vaccine. She agreed, scheduled her appointment and voiced her understanding.

## 2017-12-29 ENCOUNTER — Telehealth: Payer: Self-pay | Admitting: Family Medicine

## 2017-12-29 NOTE — Telephone Encounter (Signed)
Copied from CRM [id number]. Topic: Quick Communication - See Telephone Encounter >> Dec 29, 2017 12:46 PM Valentina LucksMatos, Jackelin wrote: CRM for notification. See Telephone encounter for: 12/29/17.   Pt dropped off document to be filled out by provider (Immunization Record Form - 1 page- only needs providers signature) Pt states needs document ASAP and to call her when document ready to pick up at [phone]. Document put at front office tray under providers name.

## 2017-12-31 NOTE — Telephone Encounter (Signed)
Called the patient to inform form is done and will fax for her.

## 2017-12-31 NOTE — Telephone Encounter (Signed)
Patient called to check status.

## 2017-12-31 NOTE — Telephone Encounter (Signed)
Patient calling and states that if the paperwork that she had dropped off is not going to be complete in 30-45 minutes, she is requesting that it be faxed to [phone] when it is complete. CB#: [phone]

## 2018-01-26 ENCOUNTER — Ambulatory Visit: Payer: PRIVATE HEALTH INSURANCE | Admitting: Family Medicine

## 2018-01-26 ENCOUNTER — Encounter: Payer: Self-pay | Admitting: Family Medicine

## 2018-01-26 VITALS — BP 98/62 | HR 80 | Temp 98.6°F | Ht 66.0 in | Wt 177.5 lb

## 2018-01-26 DIAGNOSIS — L659 Nonscarring hair loss, unspecified: Secondary | ICD-10-CM | POA: Diagnosis not present

## 2018-01-26 DIAGNOSIS — R61 Generalized hyperhidrosis: Secondary | ICD-10-CM

## 2018-01-26 LAB — FERRITIN: Ferritin: 19.4 ng/mL (ref 10.0–291.0)

## 2018-01-26 LAB — COMPREHENSIVE METABOLIC PANEL
ALT: 13 U/L (ref 0–35)
AST: 17 U/L (ref 0–37)
Albumin: 4.5 g/dL (ref 3.5–5.2)
Alkaline Phosphatase: 59 U/L (ref 39–117)
BUN: 12 mg/dL (ref 6–23)
CO2: 27 mEq/L (ref 19–32)
Calcium: 9.6 mg/dL (ref 8.4–10.5)
Chloride: 103 mEq/L (ref 96–112)
Creatinine, Ser: 0.68 mg/dL (ref 0.40–1.20)
GFR: 114.2 mL/min (ref >60.00)
Glucose, Bld: 89 mg/dL (ref 70–99)
Potassium: 4.4 mEq/L (ref 3.5–5.1)
Sodium: 136 mEq/L (ref 135–145)
Total Bilirubin: 1.2 mg/dL (ref 0.2–1.2)
Total Protein: 8.3 g/dL (ref 6.0–8.3)

## 2018-01-26 LAB — T4, FREE: Free T4: 0.91 ng/dL (ref 0.60–1.60)

## 2018-01-26 LAB — CBC
HEMATOCRIT: 39.6 % (ref 36.0–46.0)
Hemoglobin: 13.5 g/dL (ref 12.0–15.0)
MCHC: 34.2 g/dL (ref 30.0–36.0)
MCV: 87.1 fl (ref 78.0–100.0)
Platelets: 216 10*3/uL (ref 150.0–400.0)
RBC: 4.55 Mil/uL (ref 3.87–5.11)
RDW: 12.9 % (ref 11.5–15.5)
WBC: 7.7 10*3/uL (ref 4.0–10.5)

## 2018-01-26 LAB — TSH: TSH: 1.33 u[IU]/mL (ref 0.35–4.50)

## 2018-01-26 LAB — FOLLICLE STIMULATING HORMONE: FSH: 9.2 m[IU]/mL

## 2018-01-26 LAB — LUTEINIZING HORMONE: LH: 4.73 m[IU]/mL

## 2018-01-26 LAB — HEMOGLOBIN A1C: Hgb A1c MFr Bld: 4.6 % (ref 4.6–6.5)

## 2018-01-26 LAB — IBC PANEL
Iron: 121 ug/dL (ref 42–145)
Saturation Ratios: 38.6 % (ref 20.0–50.0)
Transferrin: 224 mg/dL (ref 212.0–360.0)

## 2018-01-26 NOTE — Patient Instructions (Addendum)
Give [place] 3 days to get the results of your labs back.   It might not be a bad idea to take an iron supplement.   Let us know if you need anything.

## 2018-01-26 NOTE — Progress Notes (Signed)
Chief Complaint  Patient presents with  . hormones    discuss    Subjective: Patient is a 22 y.o. female here for question about her hormones.  2 mo has been having night sweats. States she feels cold a lot, does use a fan at night, does not use a lot of covers or sleep in excessively warm clothing. Associated hair loss, loose stools, inability to lose weight, fatigue, and irreg cycle (Lyletta IUD). Denies any fevers, N/V, palpitations, constipation, easy bleeding, recent travel, recent illness, med changes. +famhx of diabetes in mgm and unknown thyroid issue in mom.   ROS: Heart: Denies palpitations Skin: +hair loss  Family History  Problem Relation Age of Onset  . Thyroid disease Mother   . Diabetes Maternal Grandmother   . Cancer Maternal Grandmother        cervical  . Cancer Maternal Grandfather   . Hearing loss Neg Hx    Past Medical History:  Diagnosis Date  . Anemia   . Anxiety    no meds  . Blood transfusion without reported diagnosis 2014   x3  . Hemoglobin C trait (HCC)   . History of blood transfusion 01/17/2014   X 3 09/2012    Allergies  Allergen Reactions  . Diflucan [Fluconazole] Hives    Past Surgical History:  Procedure Laterality Date  . NO PAST SURGERIES       Objective: BP 98/62 (BP Location: Left Arm, Patient Position: Sitting, Cuff Size: Normal)   Pulse 80   Temp 98.6 F (37 C) (Oral)   Ht  (1.676 m)   Wt 177 lb 8 oz (80.5 kg)   SpO2 98%   BMI 28.65 kg/m  General: Awake, appears stated age HEENT: MMM, EOMi Neck: Soft, supple, no masses or thyromegaly Heart: RRR, no murmurs Lungs: CTAB, no rales, wheezes or rhonchi. No accessory muscle use Abd: BS+, soft, NT, ND, no masses or organomegaly Neuro: DTR's equal and symmetric throughout Psych: Age appropriate judgment and insight, normal affect and mood  Assessment and Plan: Night sweats - Plan: Comprehensive metabolic panel, CBC, TSH, T4, free, FSH, Hemoglobin A1c, LH  Hair loss  - Plan: IBC panel, Ferritin  Orders as above. May try empiric tx for GERD if above neg.  F/u pending above.  The patient voiced understanding and agreement to the plan.  Jilda Roche [place], DO 01/26/18  11:39 AM

## 2018-05-12 ENCOUNTER — Ambulatory Visit: Payer: PRIVATE HEALTH INSURANCE

## 2018-05-31 ENCOUNTER — Ambulatory Visit: Payer: PRIVATE HEALTH INSURANCE

## 2018-07-26 ENCOUNTER — Ambulatory Visit: Payer: PRIVATE HEALTH INSURANCE

## 2018-09-30 ENCOUNTER — Ambulatory Visit: Payer: Self-pay | Admitting: Family Medicine

## 2018-10-06 ENCOUNTER — Ambulatory Visit: Payer: Self-pay | Admitting: Family Medicine

## 2018-10-31 ENCOUNTER — Encounter: Payer: Self-pay | Admitting: Family Medicine

## 2019-01-27 ENCOUNTER — Ambulatory Visit (INDEPENDENT_AMBULATORY_CARE_PROVIDER_SITE_OTHER): Payer: Managed Care, Other (non HMO) | Admitting: Family Medicine

## 2019-01-27 ENCOUNTER — Other Ambulatory Visit: Payer: Self-pay

## 2019-01-27 ENCOUNTER — Encounter: Payer: Self-pay | Admitting: Family Medicine

## 2019-01-27 VITALS — BP 130/73 | HR 56 | Wt 173.0 lb

## 2019-01-27 DIAGNOSIS — Z3202 Encounter for pregnancy test, result negative: Secondary | ICD-10-CM

## 2019-01-27 DIAGNOSIS — Z30430 Encounter for insertion of intrauterine contraceptive device: Secondary | ICD-10-CM | POA: Diagnosis not present

## 2019-01-27 DIAGNOSIS — Z01812 Encounter for preprocedural laboratory examination: Secondary | ICD-10-CM

## 2019-01-27 LAB — POCT URINE PREGNANCY: Preg Test, Ur: NEGATIVE

## 2019-01-27 MED ORDER — LEVONORGESTREL 19.5 MCG/DAY IU IUD
INTRAUTERINE_SYSTEM | Freq: Once | INTRAUTERINE | Status: AC
  Administered 2019-01-27: 11:00:00 1 via INTRAUTERINE

## 2019-01-27 NOTE — Progress Notes (Signed)
IUD Procedure Note Patient identified, informed consent performed, signed copy in chart, time out was performed.  Urine pregnancy test negative.  Speculum placed in the vagina.  Cervix visualized.  Cleaned with Betadine x 2.  Grasped anteriorly with a single tooth tenaculum.  Uterus sounded to 9 cm.  Liletta  IUD placed per manufacturer's recommendations.  Strings trimmed to 3 cm. Tenaculum was removed, good hemostasis noted.  Patient tolerated procedure well.   Patient given post procedure instructions and Liletta care card with expiration date.  Patient is asked to check IUD strings periodically and follow up in 4-6 weeks for IUD check.  

## 2019-01-27 NOTE — Progress Notes (Signed)
Patient thought she was pulling a tampon out and pulled strings of IUD out. Patient desire new IUD.patient next pap due 08-2019 Armandina Stammer RN

## 2019-02-28 ENCOUNTER — Encounter: Payer: Self-pay | Admitting: Advanced Practice Midwife

## 2019-02-28 ENCOUNTER — Ambulatory Visit (INDEPENDENT_AMBULATORY_CARE_PROVIDER_SITE_OTHER): Payer: Managed Care, Other (non HMO) | Admitting: Advanced Practice Midwife

## 2019-02-28 ENCOUNTER — Other Ambulatory Visit: Payer: Self-pay

## 2019-02-28 DIAGNOSIS — Z30431 Encounter for routine checking of intrauterine contraceptive device: Secondary | ICD-10-CM

## 2019-02-28 DIAGNOSIS — Z975 Presence of (intrauterine) contraceptive device: Secondary | ICD-10-CM

## 2019-02-28 NOTE — Progress Notes (Signed)
Patient presents for string check and would like strings trimmed

## 2019-02-28 NOTE — Progress Notes (Signed)
   Subjective:    Patient ID: Vicki Mcdonald, female    DOB: 31-Mar-1996, 23 y.o.   MRN: [id number]  This is a 23 y.o. female who is s/p insertion of a Liletta IUD on 01/27/2019.  Her previous IUD got pulled out accidentally.   She has had occasional cramping.  No bleeding other than menses, which were light.  Wants strings trimmed  Other  This is a new problem. The current episode started today. Pertinent negatives include no abdominal pain, chills, congestion, coughing, fever, nausea or urinary symptoms. Nothing aggravates the symptoms. She has tried nothing for the symptoms.      Review of Systems  Constitutional: Negative for chills and fever.  HENT: Negative for congestion.   Respiratory: Negative for cough.   Gastrointestinal: Negative for abdominal pain and nausea.  Genitourinary: Negative for vaginal bleeding.       Objective:   Physical Exam Constitutional:      General: She is not in acute distress.    Appearance: Normal appearance.  HENT:     Head: Normocephalic.  Neck:     Musculoskeletal: Normal range of motion.  Cardiovascular:     Rate and Rhythm: Normal rate and regular rhythm.  Pulmonary:     Effort: Pulmonary effort is normal.  Abdominal:     General: There is no distension.     Tenderness: There is no abdominal tenderness. There is no guarding.  Genitourinary:    General: Normal vulva.     Comments: Cervix visualized Strings visible, about 1/4" long Advised if we trim, they would be flush with cervix, pt declined.  Musculoskeletal: Normal range of motion.  Skin:    General: Skin is warm and dry.  Neurological:     General: No focal deficit present.     Mental Status: She is alert.  Psychiatric:        Mood and Affect: Mood normal.           Assessment & Plan:  A:  Status post Liletta IUD      Strings visible  P;  Resume normal activities      Return PRN or for annual exam

## 2019-02-28 NOTE — Patient Instructions (Signed)

## 2019-03-15 ENCOUNTER — Encounter: Payer: Self-pay | Admitting: Family Medicine

## 2019-03-15 ENCOUNTER — Other Ambulatory Visit: Payer: Self-pay

## 2019-03-15 ENCOUNTER — Ambulatory Visit (INDEPENDENT_AMBULATORY_CARE_PROVIDER_SITE_OTHER): Payer: Managed Care, Other (non HMO) | Admitting: Family Medicine

## 2019-03-15 DIAGNOSIS — R51 Headache: Secondary | ICD-10-CM | POA: Diagnosis not present

## 2019-03-15 DIAGNOSIS — R519 Headache, unspecified: Secondary | ICD-10-CM

## 2019-03-15 MED ORDER — PROPRANOLOL HCL ER 60 MG PO CP24: 60.0000 mg | ORAL_CAPSULE | Freq: Every day | ORAL | 1 refills | Status: DC

## 2019-03-15 NOTE — Progress Notes (Signed)
No chief complaint on file.   Subjective: Patient is a 23 y.o. female here for headaches. Due to COVID-19 pandemic, we are interacting via telephone. I verified patient's ID using 2 identifiers. Patient agreed to proceed with visit via this method. Patient is at home, I am at office. Patient and I are present for visit.   Daily headaches for past 2-3 months. No famhx or personal hx of migraines or [facility]. Frontal headaches, sometimes bilateral. Bright lights seem to aggravate things. Ibuprofen and Tylenol will sometimes help. Usually lasts until she takes something. 7-8/10. No neck pain, does not chew a lot of gum. Denies injury, neurologic s/s's.   ROS: Neuro: +headaches  Past Medical History:  Diagnosis Date  . Anemia   . Anxiety    no meds  . Blood transfusion without reported diagnosis 2014   x3  . Hemoglobin C trait ([place])   . History of blood transfusion 01/17/2014   X 3 09/2012     Objective: No conversational dyspnea Age appropriate judgment and insight Nml affect and mood  Assessment and Plan: Chronic daily headache - Plan: propranolol ER (INDERAL LA) 60 MG 24 hr capsule, start this daily. Cont Ibuprofen and Tylenol for abortive care. Total time spent: 7 min F/u in 1 mo.  The patient voiced understanding and agreement to the plan.  [place], DO 03/15/19  4:34 PM

## 2019-04-21 ENCOUNTER — Other Ambulatory Visit: Payer: Self-pay

## 2019-04-21 ENCOUNTER — Encounter: Payer: Self-pay | Admitting: Family Medicine

## 2019-04-21 ENCOUNTER — Ambulatory Visit (INDEPENDENT_AMBULATORY_CARE_PROVIDER_SITE_OTHER): Payer: Managed Care, Other (non HMO) | Admitting: Family Medicine

## 2019-04-21 VITALS — Temp 98.6°F

## 2019-04-21 DIAGNOSIS — R195 Other fecal abnormalities: Secondary | ICD-10-CM

## 2019-04-21 DIAGNOSIS — Z20822 Contact with and (suspected) exposure to covid-19: Secondary | ICD-10-CM

## 2019-04-21 DIAGNOSIS — R110 Nausea: Secondary | ICD-10-CM

## 2019-04-21 DIAGNOSIS — J0100 Acute maxillary sinusitis, unspecified: Secondary | ICD-10-CM | POA: Diagnosis not present

## 2019-04-21 DIAGNOSIS — M791 Myalgia, unspecified site: Secondary | ICD-10-CM

## 2019-04-21 MED ORDER — PREDNISONE 20 MG PO TABS: 40.0000 mg | ORAL_TABLET | Freq: Every day | ORAL | 0 refills | Status: AC

## 2019-04-21 MED ORDER — DOXYCYCLINE HYCLATE 100 MG PO TABS: 100.0000 mg | ORAL_TABLET | Freq: Two times a day (BID) | ORAL | 0 refills | Status: AC

## 2019-04-21 NOTE — Addendum Note (Signed)
Addended by: Sharon Seller B on: 04/21/2019 02:39 PM   Modules accepted: Orders

## 2019-04-21 NOTE — Progress Notes (Signed)
Chief Complaint  Patient presents with  . Generalized Body Aches    congestion  . Fever  . Cough    Vicki Mcdonald here for URI complaints. No conversational dyspnea Age appropriate judgment and insight Nml affect and mood  Duration: 1 day  Associated symptoms: Fever (100.1 F), sinus pain, loose stools, nausea, myalgias, rhinorrhea, myalgia and cough Denies: sinus congestion, itchy watery eyes, ear pain, ear drainage, sore throat, wheezing and shortness of breath Treatment to date: Tylenol Sick contacts: No; works in [place]:  Const: + fevers HEENT: As noted in HPI Lungs: No SOB  Past Medical History:  Diagnosis Date  . Anemia   . Anxiety    no meds  . Blood transfusion without reported diagnosis 2014   x3  . Hemoglobin C trait ([place])   . History of blood transfusion 01/17/2014   X 3 09/2012    Exam No conversational dyspnea Age appropriate judgment and insight Nml affect and mood  Acute maxillary sinusitis, recurrence not specified - Plan: predniSONE (DELTASONE) 20 MG tablet, doxycycline (VIBRA-TABS) 100 MG tablet, pred burst, ck COVID, doxy for pocket rx.   Nausea - Ck for COVID  Myalgia  Loose stools   Orders as above. Continue to push fluids, practice good hand hygiene, cover mouth when coughing. F/u prn.  Pt voiced understanding and agreement to the plan.  [place], DO 04/21/19 2:34 PM

## 2019-04-23 LAB — NOVEL CORONAVIRUS, NAA: SARS-CoV-2, NAA: NOT DETECTED

## 2019-07-20 ENCOUNTER — Telehealth: Payer: Self-pay

## 2019-07-20 ENCOUNTER — Other Ambulatory Visit: Payer: Self-pay

## 2019-07-20 ENCOUNTER — Ambulatory Visit: Payer: Managed Care, Other (non HMO) | Admitting: Medical

## 2019-07-20 ENCOUNTER — Encounter: Payer: Self-pay | Admitting: Medical

## 2019-07-20 VITALS — BP 112/74 | HR 68 | Temp 97.7°F | Resp 16 | Ht 66.0 in | Wt 166.4 lb

## 2019-07-20 DIAGNOSIS — M542 Cervicalgia: Secondary | ICD-10-CM

## 2019-07-20 MED ORDER — CYCLOBENZAPRINE HCL 10 MG PO TABS: 10.0000 mg | ORAL_TABLET | Freq: Every day | ORAL | 0 refills | Status: DC

## 2019-07-20 MED ORDER — TRAMADOL HCL 50 MG PO TABS: 50.0000 mg | ORAL_TABLET | Freq: Four times a day (QID) | ORAL | 0 refills | Status: AC | PRN

## 2019-07-20 MED ORDER — DICLOFENAC SODIUM 75 MG PO TBEC: 75.0000 mg | DELAYED_RELEASE_TABLET | Freq: Two times a day (BID) | ORAL | 0 refills | Status: DC

## 2019-07-20 MED ORDER — KETOROLAC TROMETHAMINE 60 MG/2ML IM SOLN
60.0000 mg | Freq: Once | INTRAMUSCULAR | Status: AC
  Administered 2019-07-20: 12:00:00 60 mg via INTRAMUSCULAR

## 2019-07-20 NOTE — Telephone Encounter (Signed)
Pt scheduled  

## 2019-07-20 NOTE — Patient Instructions (Signed)
For your recent severe trapezius pain, we gave you toradol 60 mg im injection today. Also gave flexeril muscle relaxant to use at night. Tomorrow early afternoon can start diclofenac nsaid rx. Also making tramadol available for severe pain to use if needed.  Can try otc salon pas patch to your tender area.  Follow up 7 days or as needed.  If you update me by my chart on Sunday as to your pain level. Work note given but if pain excessive can extend work note/excuse.

## 2019-07-20 NOTE — Progress Notes (Signed)
Subjective:    Patient ID: Vicki Mcdonald, female    DOB: October 18, 1995, 23 y.o.   MRN: [id number]  HPI  Pt in with left side neck pain. She noticed when she woke up. Pt slept in her bed last night with normal pillow. Pt did not do any excess activity or working out. Pt works as Quarry manager. Pt does not report any injury. She states woke up and turned her head when felt pain. No pain radiating to her arm.  No hx of any neck pain like this before.  Pt has not taken any med for this. Pt tried heating pad, biofreeze and ice pack.  Pt states sitting pain is 1-2 level pain. But when she moves pain level 10.  No st, no nausea, no vomiting or any fever.  Mild ha since she states using more peripheral vision since hurts for her to turn head.  When left her arm has some trapezius pain. But no shoulder pain directly.  lmp- last week.    Review of Systems  Constitutional: Negative for chills, fatigue and fever.  Respiratory: Negative for cough, chest tightness, shortness of breath and wheezing.   Cardiovascular: Negative for chest pain and palpitations.  Gastrointestinal: Negative for abdominal pain.  Musculoskeletal:       Left side trapezius area pain.  Skin: Negative for rash.  Neurological: Positive for headaches. Negative for dizziness.       Faint mild early ha.  Hematological: Negative for adenopathy. Does not bruise/bleed easily.  Psychiatric/Behavioral: Negative for behavioral problems, decreased concentration, dysphoric mood and suicidal ideas. The patient is not nervous/anxious.     Past Medical History:  Diagnosis Date  . Anemia   . Anxiety    no meds  . Blood transfusion without reported diagnosis 2014   x3  . Hemoglobin C trait ([place])   . History of blood transfusion 01/17/2014   X 3 09/2012      Social History   Socioeconomic History  . Marital status: Married    Spouse name: Not on file  . Number of children: Not on file  . Years of education: Not on file  . Highest  education level: Not on file  Occupational History  . Not on file  Social Needs  . Financial resource strain: Not on file  . Food insecurity    Worry: Not on file    Inability: Not on file  . Transportation needs    Medical: Not on file    Non-medical: Not on file  Tobacco Use  . Smoking status: Never Smoker  . Smokeless tobacco: Never Used  Substance and Sexual Activity  . Alcohol use: No  . Drug use: No  . Sexual activity: Yes    Birth control/protection: None  Lifestyle  . Physical activity    Days per week: Not on file    Minutes per session: Not on file  . Stress: Not on file  Relationships  . Social Herbalist on phone: Not on file    Gets together: Not on file    Attends religious service: Not on file    Active member of club or organization: Not on file    Attends meetings of clubs or organizations: Not on file    Relationship status: Not on file  . Intimate partner violence    Fear of current or ex partner: Not on file    Emotionally abused: Not on file    Physically abused: Not on file  Forced sexual activity: Not on file  Other Topics Concern  . Not on file  Social History Narrative  . Not on file    Past Surgical History:  Procedure Laterality Date  . NO PAST SURGERIES      Family History  Problem Relation Age of Onset  . Thyroid disease Mother   . Diabetes Maternal Grandmother   . Cancer Maternal Grandmother        cervical  . Cancer Maternal Grandfather   . Hearing loss Neg Hx     Allergies  Allergen Reactions  . Diflucan [Fluconazole] Hives    No current outpatient medications on file prior to visit.   No current facility-administered medications on file prior to visit.     BP 112/74   Pulse 68   Temp 97.7 F (36.5 C) (Temporal)   Resp 16   Ht 5\' 6"  (1.676 m)   Wt 166 lb 6.4 oz (75.5 kg)   SpO2 99%   BMI 26.86 kg/m       Objective:   Physical Exam  General Mental Status- Alert. General Appearance- Not in  acute distress.   Skin General: Color- Normal Color. Moisture- Normal Moisture.  Neck Carotid Arteries- Normal color. Moisture- Normal Moisture.No JVD. Left side trapezius direct tender mid neck para cervical area. No mid c-spine tenderness.  Chest and Lung Exam Auscultation: Breath Sounds:-Normal.  Cardiovascular Auscultation:Rythm- Regular. Murmurs & Other Heart Sounds:Auscultation of the heart reveals- No Murmurs.   Neurologic Cranial Nerve exam:- CN III-XII intact(No nystagmus), symmetric smile. Strength:- 5/5 equal and symmetric strength both upper and lower extremities.      Assessment & Plan:  For your recent severe trapezius pain, we gave you toradol 60 mg im injection today. Also gave flexeril muscle relaxant to use at night. Tomorrow early afternoon can start diclofenac nsaid rx. Also making tramadol available for severe pain to use if needed.  Can try otc salon pas patch to your tender area.  Follow up 7 days or as needed.  If you update me by my chart on Sunday as to your pain level. Work note given but if pain excessive can extend work note/excuse.  [place], PA-C

## 2019-07-20 NOTE — Telephone Encounter (Signed)
Copied from [place] [phone] [phone]. Topic: Appointment Scheduling - Scheduling Inquiry for Clinic >> Jul 20, 2019  8:05 AM Alanda Slim E wrote: Reason for CRM: Pt would like to come in today for neck stiffness / Please advise

## 2019-07-24 ENCOUNTER — Encounter: Payer: Self-pay | Admitting: Medical

## 2019-09-11 ENCOUNTER — Other Ambulatory Visit: Payer: Self-pay

## 2019-09-11 ENCOUNTER — Ambulatory Visit (INDEPENDENT_AMBULATORY_CARE_PROVIDER_SITE_OTHER): Payer: Managed Care, Other (non HMO) | Admitting: Family Medicine

## 2019-09-11 ENCOUNTER — Encounter: Payer: Self-pay | Admitting: Family Medicine

## 2019-09-11 ENCOUNTER — Other Ambulatory Visit (HOSPITAL_BASED_OUTPATIENT_CLINIC_OR_DEPARTMENT_OTHER): Payer: Self-pay | Admitting: Family Medicine

## 2019-09-11 ENCOUNTER — Other Ambulatory Visit: Payer: Self-pay | Admitting: Family Medicine

## 2019-09-11 ENCOUNTER — Ambulatory Visit (HOSPITAL_BASED_OUTPATIENT_CLINIC_OR_DEPARTMENT_OTHER)
Admission: RE | Admit: 2019-09-11 | Discharge: 2019-09-11 | Disposition: A | Discharge status: Home / Self Care | Payer: Managed Care, Other (non HMO) | Source: Ambulatory Visit | Attending: Family Medicine | Admitting: Family Medicine

## 2019-09-11 VITALS — BP 127/84 | HR 67 | Wt 170.0 lb

## 2019-09-11 DIAGNOSIS — Z30431 Encounter for routine checking of intrauterine contraceptive device: Secondary | ICD-10-CM | POA: Diagnosis not present

## 2019-09-11 DIAGNOSIS — T8332XA Displacement of intrauterine contraceptive device, initial encounter: Secondary | ICD-10-CM | POA: Insufficient documentation

## 2019-09-11 DIAGNOSIS — R102 Pelvic and perineal pain: Secondary | ICD-10-CM

## 2019-09-11 NOTE — Progress Notes (Signed)
Patient states she is unable to feel the strings this weekend. Patient started bleeding a few days earlier than normal and had some added back cramping that is not usually present. Kathrene Alu RN

## 2019-09-11 NOTE — Progress Notes (Signed)
   Subjective:    Patient ID: Vicki Mcdonald, female    DOB: April 11, 1996, 23 y.o.   MRN: [id number]  HPI Patient presents with concerns of not feeling her IUD strings. Had some back pain, followed by a period earlier than normal and didn't feel her strings when she checked.   Review of Systems     Objective:   Physical Exam Exam conducted with a chaperone present.  Constitutional:      Appearance: Normal appearance.  Abdominal:     Hernia: There is no hernia in the left inguinal area or right inguinal area.  Genitourinary:    General: Normal vulva.     Labia:        Right: No rash, tenderness, lesion or injury.        Left: No rash, tenderness, lesion or injury.      Cervix: No cervical motion tenderness, friability, erythema or eversion.     Comments: No strings seen. Lymphadenopathy:     Lower Body: No right inguinal adenopathy. No left inguinal adenopathy.  Neurological:     Mental Status: She is alert.       Assessment & Plan:  1. Intrauterine contraceptive device threads lost, initial encounter IUD strings not visible. Will get [place]. Condoms recommended for birth control until presence confirmed by [place]. - US Transvaginal Non-OB; Future - US Pelvis Complete; Future

## 2020-02-09 ENCOUNTER — Encounter: Payer: Self-pay | Admitting: Medical

## 2020-02-09 ENCOUNTER — Ambulatory Visit: Payer: Managed Care, Other (non HMO) | Admitting: Medical

## 2020-02-09 ENCOUNTER — Other Ambulatory Visit: Payer: Self-pay

## 2020-02-09 VITALS — BP 100/60 | HR 62 | Temp 97.7°F | Resp 16 | Ht 66.0 in | Wt 160.8 lb

## 2020-02-09 DIAGNOSIS — F329 Major depressive disorder, single episode, unspecified: Secondary | ICD-10-CM

## 2020-02-09 DIAGNOSIS — R519 Headache, unspecified: Secondary | ICD-10-CM | POA: Diagnosis not present

## 2020-02-09 DIAGNOSIS — F419 Anxiety disorder, unspecified: Secondary | ICD-10-CM

## 2020-02-09 DIAGNOSIS — F32A Depression, unspecified: Secondary | ICD-10-CM

## 2020-02-09 MED ORDER — BUSPIRONE HCL 7.5 MG PO TABS: 7.5000 mg | ORAL_TABLET | Freq: Two times a day (BID) | ORAL | 0 refills | Status: DC

## 2020-02-09 MED ORDER — KETOROLAC TROMETHAMINE 30 MG/ML IJ SOLN
30.0000 mg | Freq: Once | INTRAMUSCULAR | Status: AC
  Administered 2020-02-09: 30 mg via INTRAMUSCULAR

## 2020-02-09 MED ORDER — SUMATRIPTAN SUCCINATE 50 MG PO TABS: 50.0000 mg | ORAL_TABLET | ORAL | 0 refills | Status: DC | PRN

## 2020-02-09 MED ORDER — SERTRALINE HCL 25 MG PO TABS: 25.0000 mg | ORAL_TABLET | Freq: Every day | ORAL | 0 refills | Status: DC

## 2020-02-09 NOTE — Patient Instructions (Addendum)
For depression and anxiety, rx of sertrlaine low dose. Start today and in 3 days start buspar.  If you have thoughts of harm to self or others then recommend [facility] long ED evaluation. You mention rare brief thought of harm to self. But if worsen/persist then please be seen by ED. Need to proceed with caution.  If not doing well with meds then recommend psychiatrist at mood treatment see you.  For low level ha use tylenol and motrin combination.  If ha severe light sensitive/migarine like then [place] imitrex early on.  Follow up 2 weeks or as needed    For ha today in office offered toradol im. Counseled on plan going forward. If ha with neuo signs/symptoms/deficits then ED evaluation.

## 2020-02-09 NOTE — Progress Notes (Signed)
   Subjective:    Patient ID: Vicki Mcdonald, female    DOB: December 25, 1995, 24 y.o.   MRN: [id number]  HPI  Pt in for have depression and anxiety. She states anxiety since teens but depression new for about a year.  Pt has been seeing a therapist since January, It does help some. Mood seemed to help for about 4 months. Over last month mood worsened.  Decreased appetite, some fatigue, sleeping ok, decreased motivation but not cyring easily.   Very rare  And transient thought of harm to self. No plans. She states does not entertain them.  Pt considering see psychiatrist at mood treatment center.    LMP- presently.  Pt has ptsd, ocd, panic attacks.(This are main conditons as pt summarizes therapist opinion)   Daily ha at times. For one year. Start of slow but when severe has light sensitivity. No gross or motor sensory function deficits. Ha daily for one year and light sensitive about 5 days out the week.   Review of Systems  Constitutional: Negative for chills, fatigue and fever.  Respiratory: Negative for cough, chest tightness, shortness of breath and wheezing.   Cardiovascular: Negative for chest pain and palpitations.  Neurological: Positive for headaches. Negative for dizziness, seizures, weakness and numbness.       Slight ha started during interview. Pt had drive to [place] so after discussion pt wanted toradol as concern for HA worsening.  Hematological: Negative for adenopathy. Does not bruise/bleed easily.  Psychiatric/Behavioral: Positive for dysphoric mood. Negative for behavioral problems, decreased concentration and suicidal ideas. The patient is nervous/anxious.        None presently. But some random thoughts.        Objective:   Physical Exam   General Mental Status- Alert. General Appearance- Not in acute distress.   Skin General: Color- Normal Color. Moisture- Normal Moisture.  Neck Carotid Arteries- Normal color. Moisture- Normal Moisture. No carotid bruits.  No JVD.  Chest and Lung Exam Auscultation: Breath Sounds:-Normal.  Cardiovascular Auscultation:Rythm- Regular. Murmurs & Other Heart Sounds:Auscultation of the heart reveals- No Murmurs.  Abdomen Inspection:-Inspeection Normal. Palpation/Percussion:Note:No mass. Palpation and Percussion of the abdomen reveal- Non Tender, Non Distended + BS, no rebound or guarding.    Neurologic Cranial Nerve exam:- CN III-XII intact(No nystagmus), symmetric smile. Strength:- 5/5 equal and symmetric strength both upper and lower extremities.     Assessment & Plan:  For depression and anxiety, rx of sertrlaine low dose. Start today and in 3 days start buspar.  If you have thoughts of harm to self or others then recommend [facility] long ED evaluation. You mention rare brief thought of harm to self. But if worsen then please be seen. Need to proceed with caution.  If not doing well with meds then recommend psychiatrist at mood treatment see you.  For low level ha use tylenol and motrin combination.  If ha severe light sensitive/migarine like then [place] imitrex early on.  Follow up 2 weeks or as needed    For ha today in office offered toradol im. Counseled on plan going forward. If ha with neuo signs/symptoms/deficits then ED evaluation.  Time spent with patient today was 30  minutes which consisted of chart review, discussing diagnosis, work up, treatment and documentation.

## 2020-02-12 ENCOUNTER — Telehealth: Payer: Self-pay | Admitting: Family Medicine

## 2020-02-12 MED ORDER — SUMATRIPTAN SUCCINATE 50 MG PO TABS: 50.0000 mg | ORAL_TABLET | ORAL | 0 refills | Status: DC | PRN

## 2020-02-12 MED ORDER — BUSPIRONE HCL 7.5 MG PO TABS: 7.5000 mg | ORAL_TABLET | Freq: Two times a day (BID) | ORAL | 0 refills | Status: DC

## 2020-02-12 NOTE — Telephone Encounter (Signed)
[name]  Call Back # [phone]  Patient states her husband put her prescriptions in the trash by mistake.  Patient is requesting a re fill of medications : busPIRone (BUSPAR) 7.5 MG tablet [[id number]]  SUMAtriptan (IMITREX) 50 MG tablet [[id number]]   [id number] - [place] [place] - [address]  [address], [place] [address]  Phone:  [phone] Fax:  [phone]  DEA #:  [id number]

## 2020-02-12 NOTE — Telephone Encounter (Signed)
Medication refilled

## 2020-02-26 ENCOUNTER — Ambulatory Visit: Payer: Managed Care, Other (non HMO) | Admitting: Family Medicine

## 2020-03-02 ENCOUNTER — Other Ambulatory Visit: Payer: Self-pay | Admitting: Medical

## 2020-03-05 ENCOUNTER — Ambulatory Visit: Payer: Managed Care, Other (non HMO) | Admitting: Family Medicine

## 2020-03-08 ENCOUNTER — Encounter: Payer: Self-pay | Admitting: Family Medicine

## 2020-03-08 ENCOUNTER — Ambulatory Visit (INDEPENDENT_AMBULATORY_CARE_PROVIDER_SITE_OTHER): Payer: Managed Care, Other (non HMO) | Admitting: Family Medicine

## 2020-03-08 ENCOUNTER — Telehealth: Payer: Self-pay | Admitting: Family Medicine

## 2020-03-08 ENCOUNTER — Other Ambulatory Visit: Payer: Self-pay

## 2020-03-08 VITALS — BP 116/70 | HR 63 | Temp 95.2°F | Ht 66.0 in | Wt 165.0 lb

## 2020-03-08 DIAGNOSIS — F411 Generalized anxiety disorder: Secondary | ICD-10-CM

## 2020-03-08 DIAGNOSIS — F325 Major depressive disorder, single episode, in full remission: Secondary | ICD-10-CM | POA: Diagnosis not present

## 2020-03-08 MED ORDER — HYDROXYZINE HCL 25 MG PO TABS: 25.0000 mg | ORAL_TABLET | Freq: Three times a day (TID) | ORAL | 1 refills | Status: DC | PRN

## 2020-03-08 MED ORDER — SERTRALINE HCL 25 MG PO TABS: 25.0000 mg | ORAL_TABLET | Freq: Every day | ORAL | 1 refills | Status: DC

## 2020-03-08 NOTE — Telephone Encounter (Signed)
Patient called and will come by to sign/last page and then I will refax. Will then put original at the front for her records and she will pickup.

## 2020-03-08 NOTE — Patient Instructions (Addendum)
Send me a message in 1 week if the detox did not turn out as planned.  Keep the diet clean and stay active.  We are stopping the BuSpar on a scheduled basis. Take it when you have a surge of anxiety. Let me know if it works well.   Coping skills Choose 5 that work for you:  Take a deep breath  Count to 20  Read a book  Do a puzzle  Meditate  Bake  Sing  Knit  Garden  Pray  Go outside  Call a friend  Listen to music  Take a walk  Color  Send a note  Take a bath  Watch a movie  Be alone in a quiet place  Pet an animal  Visit a friend  Journal  Exercise  Stretch   Let us know if you need anything.

## 2020-03-08 NOTE — Telephone Encounter (Signed)
PCP completed FMLA. Faxed to number on paperwork. Copied and sent to scan. Called left message paper work is ready for pickup.

## 2020-03-08 NOTE — Progress Notes (Signed)
Chief Complaint  Patient presents with  . Follow-up    Subjective Vicki Mcdonald presents for f/u anxiety/depression.  Pt is currently being treated with Zoloft 25 mg/d, was not taking BuSpar consistently.  Reports 75% improvement since treatment. Depression s/s's resolved as well as HA's. Still having anxiety, though less so. Interested in a prn tx.  No thoughts of harming self or others. No self-medication with alcohol, prescription drugs or illicit drugs. Pt is not following with a counselor/psychologist.  Past Medical History:  Diagnosis Date  . Anemia   . Anxiety    no meds  . Blood transfusion without reported diagnosis 2014   x3  . Hemoglobin C trait (HCC)   . History of blood transfusion 01/17/2014   X 3 09/2012    Allergies as of 03/08/2020      Reactions   Diflucan [fluconazole] Hives      Medication List       Accurate as of March 08, 2020  1:46 PM. If you have any questions, ask your nurse or doctor.        STOP taking these medications   busPIRone 7.5 MG tablet Commonly known as: BUSPAR Stopped by: Sharlene Dory, DO   cyclobenzaprine 10 MG tablet Commonly known as: FLEXERIL Stopped by: Sharlene Dory, DO   SUMAtriptan 50 MG tablet Commonly known as: Imitrex Stopped by: Sharlene Dory, DO     TAKE these medications   hydrOXYzine 25 MG tablet Commonly known as: ATARAX/VISTARIL Take 1-3 tablets (25-75 mg total) by mouth 3 (three) times daily as needed for anxiety. Started by: Sharlene Dory, DO   sertraline 25 MG tablet Commonly known as: ZOLOFT Take 1 tablet (25 mg total) by mouth daily.       Exam BP 116/70 (BP Location: Left Arm, Patient Position: Sitting, Cuff Size: Normal)   Pulse 63   Temp (!) 95.2 F (35.1 C) (Temporal)   Ht 5\' 6"  (1.676 m)   Wt 165 lb (74.8 kg)   SpO2 100%   BMI 26.63 kg/m  General:  well developed, well nourished, in no apparent distress Lungs:  No respiratory distress Psych:  well oriented with normal range of affect and age-appropriate judgement/insight, alert and oriented x4.  Assessment and Plan  Depression, major, single episode, complete remission (HCC)  GAD (generalized anxiety disorder) - Plan: hydrOXYzine (ATARAX/VISTARIL) 25 MG tablet  Doing well w depression. Stop BuSpar. Cont Zoloft for now. She will detox and see if that affects her GI s/s's from med. If not, will change to Lexapro 5 mg/d. Add Vistaril prn.  Fu in 6 weeks.  The patient voiced understanding and agreement to the plan.  [place], DO 03/08/20 1:46 PM

## 2020-03-11 ENCOUNTER — Ambulatory Visit: Payer: Managed Care, Other (non HMO) | Admitting: Family Medicine

## 2020-03-22 ENCOUNTER — Other Ambulatory Visit: Payer: Self-pay | Admitting: Family Medicine

## 2020-03-22 ENCOUNTER — Telehealth: Payer: Self-pay

## 2020-03-22 MED ORDER — ESCITALOPRAM OXALATE 5 MG PO TABS: 5.0000 mg | ORAL_TABLET | Freq: Every day | ORAL | 3 refills | Status: DC

## 2020-03-22 NOTE — Telephone Encounter (Signed)
Nurse Assessment Nurse: Clarita Leber, RN, Deborah Date/Time (Eastern Time): 03/22/2020 1:47:39 PM Confirm and document reason for call. If symptomatic, describe symptoms. ---The caller states that she is experiencing anxiety and her heart is racing. Took her anti-anxiety med (Hydralazine 25 mg) and it is worse. States that her heart is racing and feels nausea and SOB. Has the patient had close contact with a person known or suspected to have the novel coronavirus illness OR traveled / lives in area with major community spread (including international travel) in the last 14 days from the onset of symptoms? * If Asymptomatic, screen for exposure and travel within the last 14 days. ---No Does the patient have any new or worsening symptoms? ---Yes Will a triage be completed? ---Yes Related visit to physician within the last 2 weeks? ---No Does the PT have any chronic conditions? (i.e. diabetes, asthma, this includes High risk factors for pregnancy, etc.) ---Yes List chronic conditions. ---anxiety, depression, OCD, and anemia Is the patient pregnant or possibly pregnant? (Ask all females between the ages of 45-55) ---No Is this a behavioral health or substance abuse call? ---No Guidelines Guideline Title Affirmed Question Affirmed Notes Nurse Date/Time (Eastern Time) Anxiety and Panic Attack [1] Difficulty breathing AND [2] persists > 10 minutes AND [3] not relieved by reassurance provided by triager Clarita Leber, RN, Deborah 03/22/2020 1:50:35 PM  Disp. Time Lamount Cohen Time) Disposition Final User 03/22/2020 1:56:04 PM Go to ED Now Yes Clarita Leber, RN, Jetty Duhamel Disagree/Comply Disagree Caller Understands Yes PreDisposition Go to ED Care Advice Given Per Guideline GO TO ED NOW: Comments User: Alita Chyle, RN Date/Time Lamount Cohen Time): 03/22/2020 1:58:53 PM The caller was at work and this nurse asked her if her manager knew that she was struggling right now. She states that she took  her anti-anxiety medication and it made her anxiety worse. She states that she was having trouble breathing and feeling nauseated. She was instructed to go to the ED but refused. This nurse requested that she go speak with her manager and left her know what is going on. 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 Referrals GO TO FACILITY REFUSED

## 2020-03-22 NOTE — Telephone Encounter (Signed)
Patient advised to go to the ED, but refused was at work and would speak to her Production designer, theatre/television/film.

## 2020-03-23 ENCOUNTER — Other Ambulatory Visit: Payer: Self-pay | Admitting: Family Medicine

## 2020-03-23 MED ORDER — GABAPENTIN 300 MG PO CAPS: 300.0000 mg | ORAL_CAPSULE | Freq: Three times a day (TID) | ORAL | 3 refills | Status: DC | PRN

## 2020-03-23 NOTE — Telephone Encounter (Signed)
Schedule her this week. TY.

## 2020-03-26 NOTE — Telephone Encounter (Signed)
Called and scheduled appointment with PCP for Friday 04/05/2020

## 2020-04-05 ENCOUNTER — Ambulatory Visit: Payer: Managed Care, Other (non HMO) | Admitting: Family Medicine

## 2020-04-05 ENCOUNTER — Other Ambulatory Visit: Payer: Self-pay

## 2020-04-05 ENCOUNTER — Encounter: Payer: Self-pay | Admitting: Family Medicine

## 2020-04-05 VITALS — BP 104/60 | HR 98 | Temp 98.2°F | Ht 66.0 in | Wt 166.2 lb

## 2020-04-05 DIAGNOSIS — F325 Major depressive disorder, single episode, in full remission: Secondary | ICD-10-CM

## 2020-04-05 DIAGNOSIS — F411 Generalized anxiety disorder: Secondary | ICD-10-CM | POA: Diagnosis not present

## 2020-04-05 LAB — POCT URINE PREGNANCY: Preg Test, Ur: NEGATIVE

## 2020-04-05 MED ORDER — NORTRIPTYLINE HCL 10 MG PO CAPS: 10.0000 mg | ORAL_CAPSULE | Freq: Every day | ORAL | 3 refills | Status: DC

## 2020-04-05 MED ORDER — GABAPENTIN 300 MG PO CAPS: 300.0000 mg | ORAL_CAPSULE | Freq: Three times a day (TID) | ORAL | 3 refills | Status: DC | PRN

## 2020-04-05 NOTE — Progress Notes (Signed)
Chief Complaint  Patient presents with  . Anxiety    Subjective Vicki Mcdonald presents for f/u anxiety/depression.  Pt is currently being treated with Lexapro.  Reports very minimal improvement since treatment. No thoughts of harming self or others. No self-medication with alcohol, prescription drugs or illicit drugs. Has GI issues as well and wonders if it is IBS related.  Pt is following with a counselor/psychologist.  Past Medical History:  Diagnosis Date  . Anemia   . Anxiety    no meds  . Blood transfusion without reported diagnosis 2014   x3  . Hemoglobin C trait (HCC)   . History of blood transfusion 01/17/2014   X 3 09/2012    Allergies as of 04/05/2020      Reactions   Diflucan [fluconazole] Hives      Medication List       Accurate as of April 05, 2020  1:17 PM. If you have any questions, ask your nurse or doctor.        STOP taking these medications   escitalopram 5 MG tablet Commonly known as: Lexapro Stopped by: Sharlene Dory, DO     TAKE these medications   gabapentin 300 MG capsule Commonly known as: NEURONTIN Take 1 capsule (300 mg total) by mouth 3 (three) times daily as needed (anxiety or panic attacks).   nortriptyline 10 MG capsule Commonly known as: Pamelor Take 1 capsule (10 mg total) by mouth at bedtime. Started by: Sharlene Dory, DO       Exam BP 104/60 (BP Location: Left Arm, Patient Position: Sitting, Cuff Size: Normal)   Pulse 98   Temp 98.2 F (36.8 C) (Oral)   Ht 5\' 6"  (1.676 m)   Wt 166 lb 4 oz (75.4 kg)   SpO2 97%   BMI 26.83 kg/m  General:  well developed, well nourished, in no apparent distress Lungs:  No respiratory distress Psych: well oriented with normal range of affect and age-appropriate judgement/insight, alert and oriented x4.  Assessment and Plan  Depression, major, single episode, complete remission (HCC) - Plan: nortriptyline (PAMELOR) 10 MG capsule  GAD (generalized anxiety disorder)  - Plan: nortriptyline (PAMELOR) 10 MG capsule, gabapentin (NEURONTIN) 300 MG capsule  Uncontrolled. Change Lexapro to nortriptyline as above. Gabapentin prn. Cont w counseling. Cont exercising. Send me message in 2-3 weeks and we might increase dosage. I will see her back in 6 weeks. The patient voiced understanding and agreement to the plan.  [place], DO 04/05/20 1:17 PM

## 2020-04-05 NOTE — Addendum Note (Signed)
Addended by: Scharlene Gloss B on: 04/05/2020 01:31 PM   Modules accepted: Orders

## 2020-04-05 NOTE — Patient Instructions (Addendum)
Send me a message in 2-3 weeks if we aren't where we want to be.  Stay active.  Let me know if there are cost issues. There should not be with these medications.  Continue with the counseling team.  Stay active, don't neglect your stretching/cardio!  Let us know if you need anything.

## 2020-04-10 NOTE — Telephone Encounter (Signed)
Paperwork is on my desk (PCP CMA) awaiting the patient to return to the office to sign/will then fax.

## 2020-04-22 NOTE — Telephone Encounter (Signed)
Called today (04/22/20) to remind to come by the office to sign her paperwork. The patient had forgotten to do so. She stated she would be by the office on Thursday 04/25/20 and would sign. I will be out of the office but will leave form on my desk.

## 2020-04-23 ENCOUNTER — Ambulatory Visit: Payer: Managed Care, Other (non HMO) | Admitting: Family Medicine

## 2020-05-20 ENCOUNTER — Ambulatory Visit: Payer: Managed Care, Other (non HMO) | Admitting: Family Medicine

## 2020-06-03 ENCOUNTER — Other Ambulatory Visit: Payer: Self-pay

## 2020-06-03 ENCOUNTER — Other Ambulatory Visit (HOSPITAL_COMMUNITY)
Admission: RE | Admit: 2020-06-03 | Discharge: 2020-06-03 | Disposition: A | Discharge status: Home / Self Care | Payer: Managed Care, Other (non HMO) | Source: Ambulatory Visit | Attending: Family Medicine | Admitting: Family Medicine

## 2020-06-03 ENCOUNTER — Encounter: Payer: Self-pay | Admitting: Family Medicine

## 2020-06-03 ENCOUNTER — Ambulatory Visit (INDEPENDENT_AMBULATORY_CARE_PROVIDER_SITE_OTHER): Payer: Managed Care, Other (non HMO) | Admitting: Family Medicine

## 2020-06-03 VITALS — BP 118/60 | HR 56 | Temp 98.2°F | Ht 66.0 in | Wt 160.2 lb

## 2020-06-03 DIAGNOSIS — Z113 Encounter for screening for infections with a predominantly sexual mode of transmission: Secondary | ICD-10-CM | POA: Diagnosis not present

## 2020-06-03 DIAGNOSIS — F411 Generalized anxiety disorder: Secondary | ICD-10-CM | POA: Diagnosis not present

## 2020-06-03 DIAGNOSIS — Z114 Encounter for screening for human immunodeficiency virus [HIV]: Secondary | ICD-10-CM | POA: Diagnosis not present

## 2020-06-03 NOTE — Progress Notes (Signed)
Chief Complaint  Patient presents with  . Follow-up    Subjective Vicki Mcdonald presents for f/u anxiety/depression.  Started on Nortriptyline 10 mg/d. Stopped 2 weeks ago bc she felt it made her manic. Of note, it did help her anxiety. Had a sexual encounter and while asymptomatic, she would like to be tested. Has appt w psych this week. Failed SSRIs 2/2 GI AE's. BuSpar made things worse.   Past Medical History:  Diagnosis Date  . Anemia   . Anxiety    no meds  . Blood transfusion without reported diagnosis 2014   x3  . Hemoglobin C trait (HCC)   . History of blood transfusion 01/17/2014   X 3 09/2012    Allergies as of 06/03/2020      Reactions   Diflucan [fluconazole] Hives      Medication List       Accurate as of June 03, 2020  9:07 AM. If you have any questions, ask your nurse or doctor.        STOP taking these medications   gabapentin 300 MG capsule Commonly known as: NEURONTIN Stopped by: Sharlene Dory, DO   nortriptyline 10 MG capsule Commonly known as: Media planner Stopped by: Sharlene Dory, DO       Exam BP 118/60 (BP Location: Left Arm, Patient Position: Sitting, Cuff Size: Normal)   Pulse (!) 56   Temp 98.2 F (36.8 C) (Oral)   Ht 5\' 6"  (1.676 m)   Wt 160 lb 4 oz (72.7 kg)   SpO2 98%   BMI 25.87 kg/m  General:  well developed, well nourished, in no apparent distress Lungs:  No respiratory distress Psych: well oriented with normal range of affect and age-appropriate judgement/insight, alert and oriented x4.  Assessment and Plan  GAD (generalized anxiety disorder)  Screening for HIV (human immunodeficiency virus) - Plan: HIV Antibody (routine testing w rflx)  Routine screening for STI (sexually transmitted infection) - Plan: Urine cytology ancillary only([place])  Will hold off on adding any other medication as she is seeing psych this week. Unfortunate as most things we put her on worked well, but she could not  tolerate AE's.  Ck GC, chlamydia, trich, HIV.  F/u in 6 mo for CPE and I will see her yearly or prn. The patient voiced understanding and agreement to the plan.  [place], DO 06/03/20 9:07 AM

## 2020-06-03 NOTE — Patient Instructions (Addendum)
Pamelor- Mania BuSpar- Made anxiety worse Lexapro/Zoloft- diarrhea Neurontin- Did not use  Give [place] 4-5 business days to get the results of your labs back.   Let us know if you need anything.

## 2020-06-03 NOTE — Addendum Note (Signed)
Addended by: Scharlene Gloss B on: 06/03/2020 09:13 AM   Modules accepted: Orders

## 2020-06-04 LAB — URINE CYTOLOGY ANCILLARY ONLY
Chlamydia: NEGATIVE
Comment: NEGATIVE
Comment: NEGATIVE
Comment: NORMAL
Neisseria Gonorrhea: NEGATIVE
Trichomonas: NEGATIVE

## 2020-06-04 LAB — HIV ANTIBODY (ROUTINE TESTING W REFLEX): HIV 1&2 Ab, 4th Generation: NONREACTIVE

## 2020-09-03 ENCOUNTER — Other Ambulatory Visit: Payer: Self-pay

## 2020-09-03 ENCOUNTER — Encounter: Payer: Self-pay | Admitting: Family Medicine

## 2020-09-03 ENCOUNTER — Ambulatory Visit: Payer: Managed Care, Other (non HMO) | Admitting: Family Medicine

## 2020-09-03 VITALS — BP 102/62 | HR 75 | Temp 98.1°F | Ht 66.0 in | Wt 149.5 lb

## 2020-09-03 DIAGNOSIS — R42 Dizziness and giddiness: Secondary | ICD-10-CM

## 2020-09-03 LAB — COMPREHENSIVE METABOLIC PANEL
ALT: 16 U/L (ref 0–35)
AST: 16 U/L (ref 0–37)
Albumin: 4.8 g/dL (ref 3.5–5.2)
Alkaline Phosphatase: 43 U/L (ref 39–117)
BUN: 10 mg/dL (ref 6–23)
CO2: 28 mEq/L (ref 19–32)
Calcium: 9.9 mg/dL (ref 8.4–10.5)
Chloride: 101 mEq/L (ref 96–112)
Creatinine, Ser: 0.87 mg/dL (ref 0.40–1.20)
GFR: 92.63 mL/min (ref >60.00)
Glucose, Bld: 79 mg/dL (ref 70–99)
Potassium: 4 mEq/L (ref 3.5–5.1)
Sodium: 138 mEq/L (ref 135–145)
Total Bilirubin: 1 mg/dL (ref 0.2–1.2)
Total Protein: 7.9 g/dL (ref 6.0–8.3)

## 2020-09-03 LAB — VITAMIN D 25 HYDROXY (VIT D DEFICIENCY, FRACTURES): VITD: 26.75 ng/mL — ABNORMAL LOW (ref 30.00–100.00)

## 2020-09-03 LAB — CBC
HCT: 40.5 % (ref 36.0–46.0)
Hemoglobin: 13.7 g/dL (ref 12.0–15.0)
MCHC: 33.8 g/dL (ref 30.0–36.0)
MCV: 89.4 fl (ref 78.0–100.0)
Platelets: 193 10*3/uL (ref 150.0–400.0)
RBC: 4.53 Mil/uL (ref 3.87–5.11)
RDW: 13.1 % (ref 11.5–15.5)
WBC: 6.3 10*3/uL (ref 4.0–10.5)

## 2020-09-03 LAB — IBC + FERRITIN
Ferritin: 54.6 ng/mL (ref 10.0–291.0)
Iron: 107 ug/dL (ref 42–145)
Saturation Ratios: 40.2 % (ref 20.0–50.0)
Transferrin: 190 mg/dL — ABNORMAL LOW (ref 212.0–360.0)

## 2020-09-03 NOTE — Patient Instructions (Addendum)
Give [place] 2-3 business days to get the results of your labs back.   See if there is a correlation between anxiety and the lightheaded episodes.  Try to drink 55-60 oz of water daily outside of exercise.   Let us know if you need anything.

## 2020-09-03 NOTE — Progress Notes (Signed)
Chief Complaint  Patient presents with  . Dizziness    Weakness.  Wants to have iron checked     Subjective: Patient is a 24 y.o. female here for lightheadedness.  Over the last month, the patient has been having intermittent lightheadedness, usually around midmorning.  Eating does not seem to affect this.  Has gotten worse over the past 2 weeks.  She does have a history of iron deficiency and notes this feels similar.  She has not had her labs drawn in several years.  She denies any heavy menses, blood in her urine, blood in her stool, worsening bruising, or blood from her mouth.  She is not taking any supplements including iron.  She is currently seeing psychiatry, unsure if anxiety is related to her symptoms.  She drinks around 16 ounces of water daily.  This decreased took place over the last couple weeks also.  Past Medical History:  Diagnosis Date  . Anemia   . Anxiety    no meds  . Blood transfusion without reported diagnosis 2014   x3  . Hemoglobin C trait (HCC)   . History of blood transfusion 01/17/2014   X 3 09/2012     Objective: BP 102/62 (BP Location: Left Arm, Patient Position: Sitting, Cuff Size: Normal)   Pulse 75   Temp 98.1 F (36.7 C) (Oral)   Ht 5\' 6"  (1.676 m)   Wt 149 lb 8 oz (67.8 kg)   SpO2 99%   BMI 24.13 kg/m  General: Awake, appears stated age HEENT: MMM, EOMi, no subglossal icterus Heart: RRR Lungs: CTAB, no rales, wheezes or rhonchi. No accessory muscle use Abdomen: Bowel sounds present, nontender, nondistended soft Psych: Age appropriate judgment and insight, normal affect and mood  Assessment and Plan: Lightheadedness - Plan: CBC, Comprehensive metabolic panel, IBC + Ferritin, VITAMIN D 25 Hydroxy (Vit-D Deficiency, Fractures)  Check above labs.  She does not drink much water so I recommended trying to maintain around 55 to 60 ounces of water daily.  Try to keep an eye on anxiety and its relation to her symptoms.  Follow-up pending  results. The patient voiced understanding and agreement to the plan.  [place], DO 09/03/20  11:57 AM

## 2020-09-04 ENCOUNTER — Other Ambulatory Visit: Payer: Self-pay | Admitting: Family Medicine

## 2020-09-04 DIAGNOSIS — E559 Vitamin D deficiency, unspecified: Secondary | ICD-10-CM

## 2021-03-25 ENCOUNTER — Ambulatory Visit: Payer: Managed Care, Other (non HMO) | Admitting: Family Medicine

## 2021-03-25 ENCOUNTER — Other Ambulatory Visit: Payer: Self-pay

## 2021-03-25 ENCOUNTER — Ambulatory Visit (HOSPITAL_BASED_OUTPATIENT_CLINIC_OR_DEPARTMENT_OTHER)
Admission: RE | Admit: 2021-03-25 | Discharge: 2021-03-25 | Disposition: A | Discharge status: Home / Self Care | Payer: Managed Care, Other (non HMO) | Source: Ambulatory Visit | Attending: Family Medicine | Admitting: Family Medicine

## 2021-03-25 VITALS — BP 114/78 | HR 62 | Temp 99.1°F | Resp 18 | Ht 66.0 in | Wt 153.4 lb

## 2021-03-25 DIAGNOSIS — S161XXD Strain of muscle, fascia and tendon at neck level, subsequent encounter: Secondary | ICD-10-CM | POA: Diagnosis not present

## 2021-03-25 MED ORDER — CYCLOBENZAPRINE HCL 10 MG PO TABS: 10.0000 mg | ORAL_TABLET | Freq: Three times a day (TID) | ORAL | 0 refills | Status: DC | PRN

## 2021-03-25 MED ORDER — PREDNISONE 10 MG PO TABS: ORAL_TABLET | ORAL | 0 refills | Status: DC

## 2021-03-25 NOTE — Progress Notes (Signed)
Established Patient Office Visit  Subjective:  Patient ID: Vicki Mcdonald, female    DOB: 1996-03-14  Age: 25 y.o. MRN: [id number]  CC:  Chief Complaint  Patient presents with   Neck Pain    Pt states pain has been ongoing but has gotten worse within the last week. 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  It has worsened over the last week.  No known injury   Past Medical History:  Diagnosis Date   Anemia    Anxiety    no meds   Blood transfusion without reported diagnosis 2014   x3   Hemoglobin C trait (HCC)    History of blood transfusion 01/17/2014   X 3 09/2012     Past Surgical History:  Procedure Laterality Date   NO PAST SURGERIES      Family History  Problem Relation Age of Onset   Thyroid disease Mother    Diabetes Maternal Grandmother    Cancer Maternal Grandmother        cervical   Cancer Maternal Grandfather    Hearing loss Neg Hx     Social History   Socioeconomic History   Marital status: Married    Spouse name: Not on file   Number of children: Not on file   Years of education: Not on file   Highest education level: Not on file  Occupational History   Not on file  Tobacco Use   Smoking status: Never   Smokeless tobacco: Never  Substance and Sexual Activity   Alcohol use: No   Drug use: No   Sexual activity: Yes    Birth control/protection: None  Other Topics Concern   Not on file  Social History Narrative   Not on file   Social Determinants of Health   Financial Resource Strain: Not on file  Food Insecurity: Not on file  Transportation Needs: Not on file  Physical Activity: Not on file  Stress: Not on file  Social Connections: Not on file  Intimate Partner  Violence: Not on file    Outpatient Medications Prior to Visit  Medication Sig Dispense Refill   lamoTRIgine (LAMICTAL) 100 MG tablet PLEASE SEE ATTACHED FOR DETAILED DIRECTIONS     No facility-administered medications prior to visit.    Allergies  Allergen Reactions   Diflucan [Fluconazole] Hives    ROS Review of Systems  Constitutional:  Negative for appetite change, diaphoresis, fatigue and unexpected weight change.  Eyes:  Negative for pain, redness and visual disturbance.  Respiratory:  Negative for cough, chest tightness, shortness of breath and wheezing.   Cardiovascular:  Negative for chest pain, palpitations and leg swelling.  Endocrine: Negative for cold intolerance, heat intolerance, polydipsia, polyphagia and polyuria.  Genitourinary:  Negative for difficulty urinating, dysuria and frequency.  Musculoskeletal:  Positive for arthralgias, neck pain and neck stiffness. Negative for back pain and gait problem.  Neurological:  Negative for dizziness, light-headedness, numbness and headaches.     Objective:    Physical Exam Vitals and nursing note reviewed.  Constitutional:      Appearance: She is well-developed.  HENT:     Head: Normocephalic and atraumatic.  Eyes:  Conjunctiva/sclera: Conjunctivae normal.  Neck:     Thyroid: No thyromegaly.     Vascular: No carotid bruit or JVD.  Cardiovascular:     Rate and Rhythm: Normal rate and regular rhythm.     Heart sounds: Normal heart sounds. No murmur heard. Pulmonary:     Effort: Pulmonary effort is normal. No respiratory distress.     Breath sounds: Normal breath sounds. No wheezing or rales.  Chest:     Chest wall: No tenderness.  Musculoskeletal:        General: Tenderness present.     Cervical back: Neck supple. Spasms and tenderness present. No swelling, edema, deformity, erythema, signs of trauma, lacerations or rigidity. Pain with movement present. Decreased range of motion.       Back:     Comments:  + r sided neck spasm and tenderness with turning to the right  + muscle spasm R trap   Neurological:     Mental Status: She is alert and oriented to person, place, and time.   BP 114/78 (BP Location: Right Arm, Patient Position: Sitting, Cuff Size: Normal)   Pulse 62   Temp 99.1 F (37.3 C) (Oral)   Resp 18   Ht 5\' 6"  (1.676 m)   Wt 153 lb 6.4 oz (69.6 kg)   LMP 03/25/2021   SpO2 98%   BMI 24.76 kg/m  Wt Readings from Last 3 Encounters:  03/25/21 153 lb 6.4 oz (69.6 kg)  09/03/20 149 lb 8 oz (67.8 kg)  06/03/20 160 lb 4 oz (72.7 kg)     Health Maintenance Due  Topic Date Due   Pneumococcal Vaccine 17-19 Years old (1 - PCV) Never done   HPV VACCINES (1 - 2-dose series) Never done   Hepatitis C Screening  Never done   PAP-Cervical Cytology Screening  09/01/2019   PAP SMEAR-Modifier  09/01/2019   COVID-19 Vaccine (3 - Booster for Pfizer series) 12/02/2020       Topic Date Due   HPV VACCINES (1 - 2-dose series) Never done    Lab Results  Component Value Date   TSH 1.33 01/26/2018   Lab Results  Component Value Date   WBC 6.3 09/03/2020   HGB 13.7 09/03/2020   HCT 40.5 09/03/2020   MCV 89.4 09/03/2020   PLT 193.0 09/03/2020   Lab Results  Component Value Date   NA 138 09/03/2020   K 4.0 09/03/2020   CO2 28 09/03/2020   GLUCOSE 79 09/03/2020   BUN 10 09/03/2020   CREATININE 0.87 09/03/2020   BILITOT 1.0 09/03/2020   ALKPHOS 43 09/03/2020   AST 16 09/03/2020   ALT 16 09/03/2020   PROT 7.9 09/03/2020   ALBUMIN 4.8 09/03/2020   CALCIUM 9.9 09/03/2020   GFR 92.63 09/03/2020   Lab Results  Component Value Date   CHOL 119 10/28/2017   Lab Results  Component Value Date   HDL 33.60 (L) 10/28/2017   Lab Results  Component Value Date   LDLCALC 79 10/28/2017   Lab Results  Component Value Date   TRIG 33.0 10/28/2017   Lab Results  Component Value Date   CHOLHDL 4 10/28/2017   Lab Results  Component Value Date   HGBA1C 4.6 01/26/2018       Assessment & Plan:   Problem List Items Addressed This Visit       Unprioritized   Cervical strain, acute, subsequent encounter - Primary    Muscle relaxer  Heat/ ice  pred taper  If xry normal --- consider chiropractor / PT        Relevant Medications   predniSONE (DELTASONE) 10 MG tablet   cyclobenzaprine (FLEXERIL) 10 MG tablet   Other Relevant Orders   DG Cervical Spine Complete    Meds ordered this encounter  Medications   predniSONE (DELTASONE) 10 MG tablet    Sig: TAKE 3 TABLETS PO QD FOR 3 DAYS THEN TAKE 2 TABLETS PO QD FOR 3 DAYS THEN TAKE 1 TABLET PO QD FOR 3 DAYS THEN TAKE 1/2 TAB PO QD FOR 3 DAYS    Dispense:  20 tablet    Refill:  0   cyclobenzaprine (FLEXERIL) 10 MG tablet    Sig: Take 1 tablet (10 mg total) by mouth 3 (three) times daily as needed for muscle spasms.    Dispense:  30 tablet    Refill:  0    Follow-up: Return if symptoms worsen or fail to improve.    Donato Schultz, DO

## 2021-03-25 NOTE — Patient Instructions (Signed)
Cervical Sprain A cervical sprain is a stretch or tear in one or more of the ligaments in the neck. Ligaments are the tissues that connect bones. Cervical sprains can range from mild to severe. Severe cervical sprains can cause the spinal bones (vertebrae) in the neck to be unstable. This can result in spinal cord damage and in serious nervous system problems. The time that it takes for a cervical sprain to heal depends on the cause and extent of the injury. Most cervical sprains heal in 4-6 weeks. What are the causes? Cervical sprains may be caused by trauma, such as an injury from a motor vehicle accident, a fall, or a sudden forward and backward whipping movement of the head and neck (whiplash injury). Mild cervical sprains may be caused by wear and tear over time. What increases the risk? The following factors may make you more likely to develop this condition: Participating in activities that have a high risk of trauma to the neck. These include contact sports, auto racing, gymnastics, and diving. Taking risks when driving or riding in a motor vehicle. Osteoarthritis of the spine. Poor strength and flexibility of the neck. A previous neck injury. Poor posture. Spending long periods in certain positions that put stress on the neck, such as sitting at a computer for a long time. What are the signs or symptoms? Symptoms of this condition include: Pain, soreness, stiffness, tenderness, swelling, or a burning sensation in the front, back, or sides of the neck, shoulders, or upper back. Sudden tightening of neck muscles (spasms). Limited ability to move the neck. Headache. Dizziness. Nausea or vomiting. Weakness, numbness, or tingling in a hand or an arm. Symptoms may develop right away after injury, or they may develop over a few days. In some cases, symptoms may go away with treatment and return (recur) over time. How is this diagnosed? This condition may be diagnosed based on: Your  medical history. Your symptoms. Any recent injuries or known neck problems that you have, such as arthritis in the neck. A physical exam. Imaging tests, such as X-rays, MRI, and CT scan. How is this treated? This condition is treated by resting and icing the injured area and doing physical therapy exercises. Heat therapy may be used 2-3 days after the injury occurred if there is no swelling. Depending on the severity of your condition, treatment may also include: Keeping your neck in place (immobilized) for periods of time. This may be done using: A cervical collar. This supports your chin and the back of your head. A cervical traction device. This is a sling that holds up your head. The device removes weight and pressure from your neck, and it may help to relieve pain. Medicines that help to relieve pain and inflammation. Medicines that help to relax your muscles (muscle relaxants). Surgery. This is rare. Follow these instructions at home: Medicines  Take over-the-counter and prescription medicines only as told by your health care provider. Ask your health care provider if the medicine prescribed to you: Requires you to avoid driving or using heavy machinery. Can cause constipation. You may need to take these actions to prevent or treat constipation: Drink enough fluid to keep your urine pale yellow. Take over-the-counter or prescription medicines. Eat foods that are high in fiber, such as beans, whole grains, and fresh fruits and vegetables. Limit foods that are high in fat and processed sugars, such as fried or sweet foods. If you have a cervical collar: Wear the collar as told by your   health care provider. Do not remove it unless told. Ask before making any adjustments to your collar. If you have long hair, keep it outside of the collar. Ask your health care provider if you may remove the collar for cleaning and bathing. If so: Follow instructions about how to remove it  safely. Clean it by hand with mild soap and water and air-dry it completely. If your collar has removable pads, remove them every 1-2 days and wash them by hand with soap and water. Let them air-dry completely before putting them back in the collar. Tell your health care provider if your skin under the collar has irritation or sores. Managing pain, stiffness, and swelling   If directed, use a cervical traction device as told. If directed, put ice on the affected area. To do this: Put ice in a plastic bag. Place a towel between your skin and the bag. Leave the ice on for 20 minutes, 2-3 times a day. If directed, apply heat to the affected area before you do your physical therapy or as often as told by your health care provider. Use the heat source that your health care provider recommends, such as a moist heat pack or a heating pad. Place a towel between your skin and the heat source. Leave the heat on for 20-30 minutes. Remove the heat if your skin turns bright red. This is especially important if you are unable to feel pain, heat, or cold. You may have a greater risk of getting burned. Activity Do not drive while wearing a cervical collar. If you do not have a cervical collar, ask if it is safe to drive while your neck heals. Do not lift anything that is heavier than 10 lb (4.5 kg), or the limit that you are told, until your health care provider says that it is safe. Rest as told by your health care provider. If physical therapy was prescribed, do exercises as told by your health care provider or physical therapist. Return to your normal activities as told by your health care provider. Avoid positions and activities that make your symptoms worse. Ask your health care provider what activities are safe for you. General instructions Do not use any products that contain nicotine or tobacco, such as cigarettes, e-cigarettes, and chewing tobacco. These can delay healing. If you need help quitting,  ask your health care provider. Keep all follow-up visits as told by your health care provider or physical therapist. This is important. How is this prevented? To prevent a cervical sprain from happening again: Use and maintain good posture. Make any needed adjustments to your workstation to help you do this. Exercise regularly as told by your health care provider or physical therapist. Avoid risky activities that may cause a cervical sprain. Contact a health care provider if you have: Symptoms that get worse or do not get better after 2 weeks of treatment. Pain that gets worse or does not get better with medicine. New, unexplained symptoms. Sores or irritated skin on your neck from wearing your cervical collar. Get help right away if: You have severe pain. You develop numbness, tingling, or weakness in any part of your body. You cannot move a part of your body (you have paralysis). You have neck pain along with severe dizziness or headache. Summary A cervical sprain is a stretch or tear in one or more of the ligaments in the neck. Cervical sprains may be caused by trauma, such as an injury from a motor vehicle accident, a   fall, or a sudden forward and backward whipping movement of the head and neck (whiplash injury). Symptoms may develop right away after injury, or they may develop over a few days. This condition may be treated with rest, ice, heat, medicines, physical therapy, and surgery. This information is not intended to replace advice given to you by your health care provider. Make sure you discuss any questions you have with your health care provider. Document Revised: 05/17/2019 Document Reviewed: 05/17/2019 Elsevier Patient Education  2022 [employer]  

## 2021-03-25 NOTE — Assessment & Plan Note (Signed)
Muscle relaxer  Heat/ ice  pred taper  If xry normal --- consider chiropractor / PT

## 2021-03-27 ENCOUNTER — Encounter: Payer: Self-pay | Admitting: Family Medicine

## 2021-03-27 DIAGNOSIS — S161XXD Strain of muscle, fascia and tendon at neck level, subsequent encounter: Secondary | ICD-10-CM

## 2021-04-02 ENCOUNTER — Telehealth: Payer: Self-pay | Admitting: Family Medicine

## 2021-04-02 NOTE — Telephone Encounter (Signed)
Patient states she fax a work form on 03/27/21. Patient is wondering if is ready

## 2021-04-02 NOTE — Telephone Encounter (Signed)
This was given to Dr. Ernst Spell MA last week as she saw her for the visit. Ty.

## 2021-04-02 NOTE — Telephone Encounter (Signed)
I have not seen a form. 

## 2021-04-04 ENCOUNTER — Telehealth: Payer: Self-pay

## 2021-04-04 ENCOUNTER — Other Ambulatory Visit: Payer: Self-pay

## 2021-04-04 NOTE — Telephone Encounter (Signed)
Pt called in wanted to know what was going with her paperwork. The paperwork was given to Dr. Laury Axon to be filled out. I let pt know that she is out of the office until Tuesday.

## 2021-04-04 NOTE — Telephone Encounter (Signed)
We do have form. Waiting for Dr. Laury Axon to return to complete.

## 2021-04-07 NOTE — Telephone Encounter (Signed)
Form faxed. Placed in scan

## 2021-04-09 ENCOUNTER — Encounter: Payer: Self-pay | Admitting: Physical Therapy

## 2021-04-09 ENCOUNTER — Other Ambulatory Visit: Payer: Self-pay

## 2021-04-09 ENCOUNTER — Ambulatory Visit: Payer: Managed Care, Other (non HMO) | Attending: Family Medicine | Admitting: Physical Therapy

## 2021-04-09 DIAGNOSIS — M542 Cervicalgia: Secondary | ICD-10-CM | POA: Insufficient documentation

## 2021-04-09 DIAGNOSIS — R293 Abnormal posture: Secondary | ICD-10-CM | POA: Diagnosis present

## 2021-04-09 DIAGNOSIS — R252 Cramp and spasm: Secondary | ICD-10-CM | POA: Insufficient documentation

## 2021-04-09 NOTE — Therapy (Signed)
[facility] [address]  [address] [place], [place], [address] Phone: [phone]   Fax:  [phone]  Physical Therapy Evaluation  Patient Details  Name: Vicki Mcdonald MRN: [id number] Date of Birth: [birth year] Referring Provider (PT): Seabron Spates, [place]   Encounter Date: 04/09/2021   PT End of Session - 04/09/21 1010     Visit Number 1    Number of Visits 7    Date for PT Re-Evaluation 05/21/21    Authorization Type [employer]    PT Start Time 0932    PT Stop Time 1005    PT Time Calculation (min) 33 min    Activity Tolerance Patient tolerated treatment well    Behavior During Therapy [facility] for tasks assessed/performed             Past Medical History:  Diagnosis Date   Anemia    Anxiety    no meds   Blood transfusion without reported diagnosis 2014   x3   Hemoglobin C trait (HCC)    History of blood transfusion 01/17/2014   X 3 09/2012     Past Surgical History:  Procedure Laterality Date   NO PAST SURGERIES      There were no vitals filed for this visit.    Subjective Assessment - 04/09/21 0933     Subjective Patient reports having neck pain for a couple years. Reports that she recalls an episode of pain after studying and looking straight down one night and then following morning she felt a pop in her neck that was proceeded with pain. Since then, pain has fluctuated. 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 Pain is located over the R upper neck and along the midline of the spine to the shoulder blades. Denies N/T or radiation. Worse with sitting, lifting, having her head flexed for long periods. Has not gotten any benefit with heat or stretching.    Pertinent History anemia, anxiety    Limitations Sitting;Reading;Lifting;House hold activities    How long can you sit comfortably? 30-60 min    How long can you stand comfortably? not limited    How  long can you walk comfortably? not limited    Diagnostic tests 03/25/21 cervical xray: Mild straightening of the cervical spine. No acute bony abnormality identified.    Patient Stated Goals decrease pain    Currently in Pain? Yes    Pain Score 4     Pain Location Neck    Pain Orientation Right    Pain Descriptors / Indicators Dull;Burning    Pain Type Acute pain;Chronic pain                [facility] PT Assessment - 04/09/21 0930       Assessment   Medical Diagnosis Acute cervical strain    Referring Provider (PT) Loreen Freud Chase, DO    Onset Date/Surgical Date --   couple years   Hand Dominance Right    Next MD Visit not scheduled    Prior Therapy no      Precautions   Precautions None      Balance Screen   Has the patient fallen in the past 6 months No    Has the patient had a decrease in activity level because of a fear of falling?  No    Is the patient reluctant to leave their home because of  a fear of falling?  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, standing/walking, sitting, rolling/transfering patients      Cognition   Overall Cognitive Status Within Functional Limits for tasks assessed      Sensation   Light Touch Appears Intact      Coordination   Gross Motor Movements are Fluid and Coordinated Yes      Posture/Postural Control   Posture/Postural Control Postural limitations    Postural Limitations Rounded Shoulders    Posture Comments B shoulders elevated      ROM / Strength   AROM / PROM / Strength AROM;Strength      AROM   Overall AROM Comments B shoulder "burning" with R shoulder flexion but B shoulder AROM WFL in all directions    AROM  Assessment Site Cervical    Cervical Flexion 40    Cervical Extension 44    Cervical - Right Side Bend 35    Cervical - Left Side Bend 32    Cervical - Right Rotation 56   "stiff"   Cervical - Left Rotation 60      Strength   Strength Assessment Site Shoulder;Hand    Right/Left Shoulder Right;Left    Right Shoulder Flexion 4+/5    Right Shoulder ABduction 4+/5    Right Shoulder Internal Rotation 4+/5   pain   Right Shoulder External Rotation 4+/5   pain   Left Shoulder Flexion 4+/5    Left Shoulder ABduction 4+/5    Left Shoulder Internal Rotation 4+/5    Left Shoulder External Rotation 4+/5    Right/Left hand Right;Left    Right Hand Grip (lbs) 19.67   19, 20, 20   Left Hand Grip (lbs) 18.33   21, 19, 15     Palpation   Palpation comment TTP and tight in B shoulder and cervical musculature, particularly in R pec, cervical paraspinals, rhomboids, thoracic paraspinals and UT; no TTP along cervical or thoracic midline                        Objective measurements completed on examination: See above findings.               PT Education - 04/09/21 1009     Education Details prognosis, POC, HEP    Person(s) Educated Patient    Methods Explanation;Demonstration;Tactile cues;Verbal cues;Handout    Comprehension Verbalized understanding;Returned demonstration              PT Short Term Goals - 04/09/21 1013       PT SHORT TERM GOAL #1   Title Patient to be independent with initial HEP.    Time 3    Period Weeks    Status New    Target Date 04/30/21               PT Long Term Goals - 04/09/21 1014       PT LONG TERM GOAL #1   Title Patient to be independent with advanced HEP.    Time 6    Period Weeks    Status New  Target Date 05/21/21      PT LONG TERM GOAL #2   Title Patient to demonstrate cervical AROM WFL and without pain limiting.    Time 6    Period Weeks    Status New    Target Date 05/21/21      PT LONG TERM GOAL  #3   Title Patient to demonstrate and recall postural correction at rest and with activity for improved postural awareness.    Time 6    Period Weeks    Status New    Target Date 05/21/21      PT LONG TERM GOAL #4   Title Patient to report 75% improved tolerance for lifting and patient transfers at work.    Time 6    Period Weeks    Status New    Target Date 05/21/21                    Plan - 04/09/21 1010     Clinical Impression Statement Patient is a 25 y/o F presenting to OPPT with c/o acute on chronic R sided neck pain for the past 3 weeks. Pain is located over the R upper neck and along the midline of the spine to the shoulder blades. Denies N/T or radiation. Worse with sitting, lifting, and prolonged cervical flexion. 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 Patient today presenting with rounded and B elevated shoulders, limited cervical AROM, and TTP and tightness in B shoulder and cervical musculature, particularly in R pec, cervical paraspinals, rhomboids, thoracic paraspinals and UT. Patient was educated on gentle postural correction and stretching HEP- patient reported understanding. Would benefit from skilled PT services 1x/week for 6 weeks to address aforementioned impairments.    Personal Factors and Comorbidities Comorbidity 2;Past/Current Experience;Profession;Time since onset of injury/illness/exacerbation    Comorbidities anemia, anxiety    Examination-Activity Limitations Bend;Caring for Others;Carry;Dressing;Hygiene/Grooming;Lift;Reach Overhead;Self Feeding;Sit    Examination-Participation Restrictions Occupation;Meal Prep;Laundry;Yard Work;Driving;Shop;Community Activity;Cleaning;Church    Stability/Clinical Decision Making Stable/Uncomplicated    Clinical Decision Making Low    Rehab Potential Good    PT Frequency 1x / week    PT Duration 6 weeks    PT Treatment/Interventions ADLs/Self Care  Home Management;Cryotherapy;Electrical Stimulation;Moist Heat;Traction;Therapeutic exercise;Therapeutic activities;Functional mobility training;Ultrasound;Neuromuscular re-education;Patient/family education;Manual techniques;Taping;Energy conservation;Dry needling;Passive range of motion    PT Next Visit Plan cervical FOTO; reassess HEP; progress cervical mobility, postural strengthening    Consulted and Agree with Plan of Care Patient             Patient will benefit from skilled therapeutic intervention in order to improve the following deficits and impairments:  Hypomobility, Decreased activity tolerance, Decreased strength, Pain, Impaired UE functional use, Increased fascial restricitons, Increased muscle spasms, Decreased range of motion, Impaired perceived functional ability, Improper body mechanics, Postural dysfunction, Impaired flexibility  Visit Diagnosis: Cervicalgia  Cramp and spasm  Abnormal posture     Problem List Patient Active Problem List   Diagnosis Date Noted   Cervical strain, acute, subsequent encounter 03/25/2021   Depression, major, single episode, complete remission (HCC) 03/08/2020   GAD (generalized anxiety disorder) 03/08/2020   IUD (intrauterine device) in place 02/28/2019   Normal labor 03/11/2017   Supervision of normal pregnancy 08/31/2016   Hemoglobin C trait (HCC) 01/17/2014   Rh negative state in antepartum period 01/17/2014     Anette Guarneri, PT, DPT 04/09/21 11:05 AM    [facility]  [address]  [address] [place], [place], [address] Phone: [phone]   Fax:  [phone]  Name: Vicki Mcdonald MRN: [id number] Date of Birth: 05/06/1996

## 2021-04-09 NOTE — Addendum Note (Signed)
Addended by: Anette Guarneri D on: 04/09/2021 11:07 AM   Modules accepted: Orders

## 2021-04-24 ENCOUNTER — Other Ambulatory Visit: Payer: Self-pay

## 2021-04-24 ENCOUNTER — Ambulatory Visit: Payer: Managed Care, Other (non HMO) | Attending: Family Medicine | Admitting: Physical Therapy

## 2021-04-24 ENCOUNTER — Encounter: Payer: Self-pay | Admitting: Physical Therapy

## 2021-04-24 DIAGNOSIS — M542 Cervicalgia: Secondary | ICD-10-CM | POA: Diagnosis present

## 2021-04-24 DIAGNOSIS — R293 Abnormal posture: Secondary | ICD-10-CM

## 2021-04-24 DIAGNOSIS — R252 Cramp and spasm: Secondary | ICD-10-CM

## 2021-04-24 NOTE — Therapy (Signed)
[facility] [address]  [address] [place], [place], [address] Phone: [phone]   Fax:  [phone]  Physical Therapy Treatment  Patient Details  Name: Vicki Mcdonald MRN: [id number] Date of Birth: 13-Jan-1996 Referring Provider (PT): Seabron Spates, [place]   Encounter Date: 04/24/2021   PT End of Session - 04/24/21 0810     Visit Number 2    Number of Visits 7    Date for PT Re-Evaluation 05/21/21    Authorization Type [employer]    PT Start Time 0810    PT Stop Time 0848    PT Time Calculation (min) 38 min    Activity Tolerance Patient tolerated treatment well    Behavior During Therapy [facility] for tasks assessed/performed             Past Medical History:  Diagnosis Date   Anemia    Anxiety    no meds   Blood transfusion without reported diagnosis 2014   x3   Hemoglobin C trait (HCC)    History of blood transfusion 01/17/2014   X 3 09/2012     Past Surgical History:  Procedure Laterality Date   NO PAST SURGERIES      There were no vitals filed for this visit.   Subjective Assessment - 04/24/21 0813     Subjective Patient reports that she still has neck pain "it burns" but maybe mild improvement with the stretches.    Pertinent History anemia, anxiety    Limitations Sitting;Reading;Lifting;House hold activities    How long can you sit comfortably? 30-60 min    How long can you stand comfortably? not limited    How long can you walk comfortably? not limited    Diagnostic tests 03/25/21 cervical xray: Mild straightening of the cervical spine. No acute bony abnormality identified.    Patient Stated Goals decrease pain    Currently in Pain? Yes    Pain Score 6     Pain Location Neck    Pain Orientation Right    Pain Descriptors / Indicators Burning                [facility] PT Assessment - 04/24/21 0001       Observation/Other Assessments   Focus on Therapeutic Outcomes (FOTO)  55; predicted score 71                            [facility] Adult PT Treatment/Exercise - 04/24/21 0001       Exercises   Exercises Neck      Neck Exercises: Machines for Strengthening   UBE (Upper Arm Bike) L1x 6 min (26f, 3 retro)      Neck Exercises: Standing   Wall Push Ups 10 reps      Neck Exercises: Seated   Neck Retraction 10 reps;5 secs    Shoulder Rolls Backwards;10 reps    Other Seated Exercise bil scap squeezes 10 x 3 sec hold      Neck Exercises: Stretches   Levator Stretch Right;3 reps;10 seconds    Corner Stretch 3 reps;20 seconds      Manual Therapy   Manual Therapy Soft tissue mobilization;Other (comment)    Manual therapy comments noted palpable trigger points in R L/S and UT    Soft tissue mobilization STM and TPR to R L/S    Other Manual Therapy Dry needling, see flow sheet  Trigger Point Dry Needling - 04/24/21 0001     Consent Given? Yes    Education Handout Provided Yes    Muscles Treated Head and Neck Levator scapulae;Upper trapezius    Dry Needling Comments patient in prone    Upper Trapezius Response Twitch reponse elicited;Palpable increased muscle length    Levator Scapulae Response Twitch response elicited;Palpable increased muscle length                    PT Short Term Goals - 04/09/21 1013       PT SHORT TERM GOAL #1   Title Patient to be independent with initial HEP.    Time 3    Period Weeks    Status New    Target Date 04/30/21               PT Long Term Goals - 04/09/21 1014       PT LONG TERM GOAL #1   Title Patient to be independent with advanced HEP.    Time 6    Period Weeks    Status New    Target Date 05/21/21      PT LONG TERM GOAL #2   Title Patient to demonstrate cervical AROM WFL and without pain limiting.    Time 6    Period Weeks    Status New    Target Date 05/21/21      PT LONG TERM GOAL #3   Title Patient to demonstrate and recall postural correction at rest and with activity  for improved postural awareness.    Time 6    Period Weeks    Status New    Target Date 05/21/21      PT LONG TERM GOAL #4   Title Patient to report 75% improved tolerance for lifting and patient transfers at work.    Time 6    Period Weeks    Status New    Target Date 05/21/21                   Plan - 04/24/21 0810     Clinical Impression Statement Pt. reported tightness/burning in R upper trap region and demonstrated palpable trigger points in R UT and R levator scapulae.  Reviewed HEP, added LS stretch.  Manual therapy included levator release, educated and discussed dry needling including risks and precautions for dry needling over lung field and instructions for post needling, pt. consented to procedure and reported immediate improvement in ability to stretch afterwards especially in R levator.  She would benefit from continued skilled therapy.    Personal Factors and Comorbidities Comorbidity 2;Past/Current Experience;Profession;Time since onset of injury/illness/exacerbation    Comorbidities anemia, anxiety    Examination-Activity Limitations [place];Caring for Others;Carry;Dressing;Hygiene/Grooming;Lift;Reach Overhead;Self Feeding;Sit    Examination-Participation Restrictions Occupation;Meal Prep;Laundry;Yard Work;Driving;Shop;Community Activity;Cleaning;Church    Stability/Clinical Decision Making Stable/Uncomplicated    Rehab Potential Good    PT Frequency 1x / week    PT Duration 6 weeks    PT Treatment/Interventions ADLs/Self Care Home Management;Cryotherapy;Electrical Stimulation;Moist Heat;Traction;Therapeutic exercise;Therapeutic activities;Functional mobility training;Ultrasound;Neuromuscular re-education;Patient/family education;Manual techniques;Taping;Energy conservation;Dry needling;Passive range of motion    PT Next Visit Plan cervical FOTO; reassess HEP; progress cervical mobility, postural strengthening    Consulted and Agree with Plan of Care Patient              Patient will benefit from skilled therapeutic intervention in order to improve the following deficits and impairments:  Hypomobility, Decreased activity tolerance, Decreased strength, Pain, Impaired UE  functional use, Increased fascial restricitons, Increased muscle spasms, Decreased range of motion, Impaired perceived functional ability, Improper body mechanics, Postural dysfunction, Impaired flexibility  Visit Diagnosis: Cervicalgia  Cramp and spasm  Abnormal posture     Problem List Patient Active Problem List   Diagnosis Date Noted   Cervical strain, acute, subsequent encounter 03/25/2021   Depression, major, single episode, complete remission (HCC) 03/08/2020   GAD (generalized anxiety disorder) 03/08/2020   IUD (intrauterine device) in place 02/28/2019   Normal labor 03/11/2017   Supervision of normal pregnancy 08/31/2016   Hemoglobin C trait (HCC) 01/17/2014   Rh negative state in antepartum period 01/17/2014    Jena Gauss PT, DPT 04/24/2021, 12:16 PM  [facility] [address]  [address] [place], [place], [address] Phone: [phone]   Fax:  [phone]  Name: Vicki Mcdonald MRN: [id number] Date of Birth: [birth year]

## 2021-05-07 ENCOUNTER — Ambulatory Visit: Payer: Managed Care, Other (non HMO)

## 2021-05-07 DIAGNOSIS — M542 Cervicalgia: Secondary | ICD-10-CM

## 2021-05-07 DIAGNOSIS — R252 Cramp and spasm: Secondary | ICD-10-CM

## 2021-05-07 DIAGNOSIS — R293 Abnormal posture: Secondary | ICD-10-CM

## 2021-05-07 NOTE — Therapy (Signed)
[facility] [address]  [address] [place], [place], [address] Phone: [phone]   Fax:  [phone]  Physical Therapy Treatment  Patient Details  Name: Vicki Mcdonald MRN: [id number] Date of Birth: [birth year] Referring Provider (PT): Loreen Freud [place], [place]   Encounter Date: 05/07/2021   PT End of Session - 05/07/21 0849     Visit Number 3    Number of Visits 7    Date for PT Re-Evaluation 05/21/21    Authorization Type [employer]    PT Start Time 0807   pt late   PT Stop Time 0858    PT Time Calculation (min) 51 min    Activity Tolerance Patient tolerated treatment well    Behavior During Therapy [facility] for tasks assessed/performed             Past Medical History:  Diagnosis Date   Anemia    Anxiety    no meds   Blood transfusion without reported diagnosis 2014   x3   Hemoglobin C trait (HCC)    History of blood transfusion 01/17/2014   X 3 09/2012     Past Surgical History:  Procedure Laterality Date   NO PAST SURGERIES      There were no vitals filed for this visit.   Subjective Assessment - 05/07/21 0812     Subjective Pt reports stiffness when she wakes up in the am. Also much improvement from the DN last session.    Pertinent History anemia, anxiety    Diagnostic tests 03/25/21 cervical xray: Mild straightening of the cervical spine. No acute bony abnormality identified.    Patient Stated Goals decrease pain    Currently in Pain? Yes    Pain Score 4     Pain Location Neck    Pain Orientation Right    Pain Descriptors / Indicators Burning    Pain Type Acute pain;Chronic pain                               [facility] Adult PT Treatment/Exercise - 05/07/21 0001       Neck Exercises: Machines for Strengthening   UBE (Upper Arm Bike) L1x 6 min (70f, 3 retro)      Neck Exercises: Standing   Other Standing Exercises shoulder extensions and horizontal ABD with red TB 10 reps each       Neck Exercises: Seated   Other Seated Exercise thoracic extension 10x3"    Other Seated Exercise trunk rotation 5x5"      Neck Exercises: Stretches   Upper Trapezius Stretch Right;2 reps;30 seconds    Upper Trapezius Stretch Limitations seated    Levator Stretch Right;2 reps;30 seconds    Levator Stretch Limitations seated      Modalities   Modalities Moist Heat      Moist Heat Therapy   Number Minutes Moist Heat 10 Minutes    Moist Heat Location Cervical      Manual Therapy   Manual Therapy Soft tissue mobilization;Other (comment)    Soft tissue mobilization STM and TPR to R L/S, UT                    PT Education - 05/07/21 0849     Education Details HEP update    Person(s) Educated Patient    Methods Explanation;Demonstration;Verbal cues;Handout    Comprehension Verbalized understanding;Returned demonstration;Verbal cues required  PT Short Term Goals - 05/07/21 0850       PT SHORT TERM GOAL #1   Title Patient to be independent with initial HEP.    Time 3    Period Weeks    Status Achieved    Target Date 04/30/21               PT Long Term Goals - 04/09/21 1014       PT LONG TERM GOAL #1   Title Patient to be independent with advanced HEP.    Time 6    Period Weeks    Status New    Target Date 05/21/21      PT LONG TERM GOAL #2   Title Patient to demonstrate cervical AROM WFL and without pain limiting.    Time 6    Period Weeks    Status New    Target Date 05/21/21      PT LONG TERM GOAL #3   Title Patient to demonstrate and recall postural correction at rest and with activity for improved postural awareness.    Time 6    Period Weeks    Status New    Target Date 05/21/21      PT LONG TERM GOAL #4   Title Patient to report 75% improved tolerance for lifting and patient transfers at work.    Time 6    Period Weeks    Status New    Target Date 05/21/21                   Plan - 05/07/21 0850      Clinical Impression Statement Pt noted much improvement from the DN last session. Started with STM and found multiple tender points along the UT/LS area. She reported much improvement in neck mobility post MT. Followed up MT with postural exercises and thoracic mobility to improve alignment of the shoulder complex. She noted muscle fatigue with the postural strengthening exercises but completed with min cues required to target the periscap muscles. ENded session with moist heat to decrease muscle stiffness and pain post session.    Personal Factors and Comorbidities Comorbidity 2;Past/Current Experience;Profession;Time since onset of injury/illness/exacerbation    Comorbidities anemia, anxiety    PT Frequency 1x / week    PT Duration 6 weeks    PT Treatment/Interventions ADLs/Self Care Home Management;Cryotherapy;Electrical Stimulation;Moist Heat;Traction;Therapeutic exercise;Therapeutic activities;Functional mobility training;Ultrasound;Neuromuscular re-education;Patient/family education;Manual techniques;Taping;Energy conservation;Dry needling;Passive range of motion    PT Next Visit Plan reassess HEP; progress cervical mobility, postural strengthening    Consulted and Agree with Plan of Care Patient             Patient will benefit from skilled therapeutic intervention in order to improve the following deficits and impairments:  Hypomobility, Decreased activity tolerance, Decreased strength, Pain, Impaired UE functional use, Increased fascial restricitons, Increased muscle spasms, Decreased range of motion, Impaired perceived functional ability, Improper body mechanics, Postural dysfunction, Impaired flexibility  Visit Diagnosis: Cervicalgia  Cramp and spasm  Abnormal posture     Problem List Patient Active Problem List   Diagnosis Date Noted   Cervical strain, acute, subsequent encounter 03/25/2021   Depression, major, single episode, complete remission (HCC) 03/08/2020   GAD  (generalized anxiety disorder) 03/08/2020   IUD (intrauterine device) in place 02/28/2019   Normal labor 03/11/2017   Supervision of normal pregnancy 08/31/2016   Hemoglobin C trait (HCC) 01/17/2014   Rh negative state in antepartum period 01/17/2014    Darleene Cleaver,  PTA 05/07/2021, 9:02 AM  [facility] [address]  [address] [place], [place], [address] Phone: [phone]   Fax:  [phone]  Name: Vicki Mcdonald MRN: [id number] Date of Birth: [birth year]

## 2021-05-07 NOTE — Patient Instructions (Signed)
Access Code: [email or web address] URL: [email or web address] Date: 05/07/2021 Prepared by: Verta Ellen  Exercises Seated Thoracic Lumbar Extension - 2 x daily - 7 x weekly - 2 sets - 10 reps - 3 seconds hold Shoulder Extension with Resistance - 2 x daily - 7 x weekly - 2 sets - 10 reps Standing Shoulder Horizontal Abduction with Resistance - 2 x daily - 7 x weekly - 2 sets - 10 reps

## 2021-05-27 ENCOUNTER — Other Ambulatory Visit: Payer: Self-pay

## 2021-05-27 ENCOUNTER — Encounter: Payer: Self-pay | Admitting: Physical Therapy

## 2021-05-27 ENCOUNTER — Ambulatory Visit: Payer: Managed Care, Other (non HMO) | Attending: Family Medicine | Admitting: Physical Therapy

## 2021-05-27 DIAGNOSIS — R252 Cramp and spasm: Secondary | ICD-10-CM | POA: Diagnosis present

## 2021-05-27 DIAGNOSIS — M542 Cervicalgia: Secondary | ICD-10-CM

## 2021-05-27 DIAGNOSIS — R293 Abnormal posture: Secondary | ICD-10-CM | POA: Diagnosis present

## 2021-05-27 NOTE — Therapy (Signed)
[facility] [address]  [place], [place], [address] Phone: [phone]   Fax:  [phone]  Physical Therapy Treatment/Progress Note  Progress Note Reporting Period 04/09/21 to 05/27/21  See note below for Objective Data and Assessment of Progress/Goals.     Patient Details  Name: Vicki Mcdonald MRN: [id number] Date of Birth: 1995/12/03 Referring Provider (PT): Roma Schanz, [place]   Encounter Date: 05/27/2021   PT End of Session - 05/27/21 0859     Visit Number 4    Number of Visits 7    Date for PT Re-Evaluation 05/21/21    Authorization Type [employer]    Progress Note Due on Visit 10    PT Start Time 0855    PT Stop Time 0940    PT Time Calculation (min) 45 min    Activity Tolerance Patient tolerated treatment well    Behavior During Therapy [facility] for tasks assessed/performed             Past Medical History:  Diagnosis Date   Anemia    Anxiety    no meds   Blood transfusion without reported diagnosis 2014   x3   Hemoglobin C trait ([place])    History of blood transfusion 01/17/2014   X 3 09/2012     Past Surgical History:  Procedure Laterality Date   NO PAST SURGERIES      There were no vitals filed for this visit.   Subjective Assessment - 05/27/21 0858     Subjective Pt. reports that she was doing really well until she had a rough week at work, feels like it set her back.  She has been doing her initial HEP consistently, not the band exercises as much.    Pertinent History anemia, anxiety    Diagnostic tests 03/25/21 cervical xray: Mild straightening of the cervical spine. No acute bony abnormality identified.    Patient Stated Goals decrease pain    Currently in Pain? Yes    Pain Score 5     Pain Location Neck    Pain Orientation Right                [facility] PT Assessment - 05/27/21 0001       Assessment   Medical Diagnosis Acute cervical strain    Referring Provider (PT) Garnet Koyanagi Chase, DO      AROM   Overall AROM  Within functional limits for tasks performed    Cervical Flexion 50   stiff   Cervical Extension 50    Cervical - Right Rotation 65    Cervical - Left Rotation 60   stiff                          [facility] Adult PT Treatment/Exercise - 05/27/21 0001       Exercises   Exercises Neck      Neck Exercises: Machines for Strengthening   UBE (Upper Arm Bike) L1x 6 min (74f 3 retro)      Modalities   Modalities Moist Heat      Moist Heat Therapy   Number Minutes Moist Heat 10 Minutes    Moist Heat Location Cervical      Manual Therapy   Manual Therapy Soft tissue mobilization;Joint mobilization;Other (comment)    Manual therapy comments noted palpable trigger points in R L/S and UT    Joint Mobilization PA mobs C3-C6, NAGs into rotations  Soft tissue mobilization STM and TPR to R L/S, UT    Other Manual Therapy Dry needling, see flow sheet              Trigger Point Dry Needling - 05/27/21 0001     Consent Given? Yes    Education Handout Provided Previously provided    Muscles Treated Head and Neck Levator scapulae;Upper trapezius   bilateral   Dry Needling Comments patient in prone    Upper Trapezius Response Twitch reponse elicited;Palpable increased muscle length   strong twitch in LUT   Levator Scapulae Response Twitch response elicited;Palpable increased muscle length                    PT Short Term Goals - 05/07/21 0850       PT SHORT TERM GOAL #1   Title Patient to be independent with initial HEP.    Time 3    Period Weeks    Status Achieved    Target Date 04/30/21               PT Long Term Goals - 05/27/21 0902       PT LONG TERM GOAL #1   Title Patient to be independent with advanced HEP.    Time 6    Period Weeks    Status Partially Met    Target Date 07/08/21      PT LONG TERM GOAL #2   Title Patient to demonstrate cervical AROM WFL and without pain limiting.    Time  6    Period Weeks    Status On-going   Progressing, improved AROM all directions mostly limited by tightness with L rotation and flexion, no pain.   Target Date 07/08/21      PT LONG TERM GOAL #3   Title Patient to demonstrate and recall postural correction at rest and with activity for improved postural awareness.    Time 6    Period Weeks    Status Achieved   demonstrates good seated posture   Target Date 07/08/21      PT LONG TERM GOAL #4   Title Patient to report 75% improved tolerance for lifting and patient transfers at work.    Time 6    Period Weeks    Status On-going   improving   Target Date 07/08/21                   Plan - 05/27/21 1036     Clinical Impression Statement Patient had been making good progress but due to stress reports more pain in R UT/LS area again.  She has been compliant with exercises that "can do anywhere" but less so with theraband exercises.  She demonstrated trigger points throughout bil UT/LS region.  Focus of todays session was manual therapy to decrease pain and muscle tightness, including DN to bil UT and LS today, followed by MHP.  Pt would benefit from continued skilled therapy.  Due to infrequency of visits, but overall benefit when she is able to attend, extended POC today 6 weeks at 1x/week.    Personal Factors and Comorbidities Comorbidity 2;Past/Current Experience;Profession;Time since onset of injury/illness/exacerbation    Comorbidities anemia, anxiety    PT Frequency 1x / week    PT Duration 6 weeks    PT Treatment/Interventions ADLs/Self Care Home Management;Cryotherapy;Electrical Stimulation;Moist Heat;Traction;Therapeutic exercise;Therapeutic activities;Functional mobility training;Ultrasound;Neuromuscular re-education;Patient/family education;Manual techniques;Taping;Energy conservation;Dry needling;Passive range of motion    PT Next Visit Plan reassess HEP;  progress cervical mobility, postural strengthening    Consulted and  Agree with Plan of Care Patient             Patient will benefit from skilled therapeutic intervention in order to improve the following deficits and impairments:  Hypomobility, Decreased activity tolerance, Decreased strength, Pain, Impaired UE functional use, Increased fascial restricitons, Increased muscle spasms, Decreased range of motion, Impaired perceived functional ability, Improper body mechanics, Postural dysfunction, Impaired flexibility  Visit Diagnosis: Cervicalgia  Cramp and spasm  Abnormal posture     Problem List Patient Active Problem List   Diagnosis Date Noted   Cervical strain, acute, subsequent encounter 03/25/2021   Depression, major, single episode, complete remission ([place]) 03/08/2020   GAD (generalized anxiety disorder) 03/08/2020   IUD (intrauterine device) in place 02/28/2019   Normal labor 03/11/2017   Supervision of normal pregnancy 08/31/2016   Hemoglobin C trait ([place]) 01/17/2014   Rh negative state in antepartum period 01/17/2014    Rennie Natter PT, DPT 05/27/2021, 10:49 AM  [facility] [address]  [place], [place], [address] Phone: [phone]   Fax:  [phone]  Name: Mckenize Mezera MRN: [id number] Date of Birth: [birth year]

## 2021-06-02 ENCOUNTER — Ambulatory Visit: Payer: Managed Care, Other (non HMO) | Admitting: Physical Therapy

## 2021-06-10 ENCOUNTER — Ambulatory Visit: Payer: Managed Care, Other (non HMO) | Admitting: Physical Therapy

## 2021-06-10 ENCOUNTER — Encounter: Payer: Self-pay | Admitting: Physical Therapy

## 2021-06-10 ENCOUNTER — Other Ambulatory Visit: Payer: Self-pay

## 2021-06-10 DIAGNOSIS — R293 Abnormal posture: Secondary | ICD-10-CM

## 2021-06-10 DIAGNOSIS — M542 Cervicalgia: Secondary | ICD-10-CM

## 2021-06-10 DIAGNOSIS — R252 Cramp and spasm: Secondary | ICD-10-CM

## 2021-06-10 NOTE — Therapy (Signed)
[facility] [address]  [address] [place], [place], [address] Phone: [phone]   Fax:  [phone]  Physical Therapy Treatment  Patient Details  Name: Vicki Mcdonald MRN: [id number] Date of Birth: [birth year] Referring Provider (PT): Seabron Spates, [place]   Encounter Date: 06/10/2021   PT End of Session - 06/10/21 0809     Visit Number 5    Number of Visits 12    Date for PT Re-Evaluation 07/08/21    Authorization Type [employer]    Progress Note Due on Visit 10    PT Start Time 0805    PT Stop Time 0850    PT Time Calculation (min) 45 min    Activity Tolerance Patient tolerated treatment well    Behavior During Therapy [facility] for tasks assessed/performed             Past Medical History:  Diagnosis Date   Anemia    Anxiety    no meds   Blood transfusion without reported diagnosis 2014   x3   Hemoglobin C trait (HCC)    History of blood transfusion 01/17/2014   X 3 09/2012     Past Surgical History:  Procedure Laterality Date   NO PAST SURGERIES      There were no vitals filed for this visit.   Subjective Assessment - 06/10/21 0807     Subjective Patient reports neck is better, L side feels more loose    Pertinent History anemia, anxiety    Diagnostic tests 03/25/21 cervical xray: Mild straightening of the cervical spine. No acute bony abnormality identified.    Patient Stated Goals decrease pain    Currently in Pain? Yes    Pain Score 4     Pain Location Neck    Pain Orientation Right                               [facility] Adult PT Treatment/Exercise - 06/10/21 0001       Exercises   Exercises Neck      Neck Exercises: Machines for Strengthening   Nustep L6 x 5 min      Neck Exercises: Standing   Wall Push Ups 20 reps    Other Standing Exercises wall angels x 15      Neck Exercises: Seated   Other Seated Exercise upper trunk rotations side of preference 2 x 10    Other  Seated Exercise seated open book exercise 2 x 10      Neck Exercises: Supine   Other Supine Exercise self mobs on foam roller and thoracic extension (demo)      Neck Exercises: Sidelying   Other Sidelying Exercise open books x 10 bil      Neck Exercises: Stretches   Levator Stretch Right;2 reps;30 seconds      Manual Therapy   Manual Therapy Soft tissue mobilization;Other (comment)    Manual therapy comments noted palpable trigger points in R L/S and UT    Soft tissue mobilization STM and TPR to R L/S, UT    Other Manual Therapy Dry needling, see flow sheet              Trigger Point Dry Needling - 06/10/21 0001     Consent Given? Yes    Education Handout Provided Previously provided    Muscles Treated Head and Neck Levator scapulae;Upper trapezius   Right side  Dry Needling Comments patient in prone    Upper Trapezius Response Twitch reponse elicited;Palpable increased muscle length    Levator Scapulae Response Twitch response elicited;Palpable increased muscle length                   PT Education - 06/10/21 1317     Education Details HEP update for thoracic spine; Access Code: [email or web address]    Person(s) Educated Patient    Methods Explanation;Demonstration;Handout    Comprehension Verbalized understanding;Returned demonstration              PT Short Term Goals - 05/07/21 0850       PT SHORT TERM GOAL #1   Title Patient to be independent with initial HEP.    Time 3    Period Weeks    Status Achieved    Target Date 04/30/21               PT Long Term Goals - 06/10/21 1320       PT LONG TERM GOAL #1   Title Patient to be independent with advanced HEP.    Time 6    Period Weeks    Status On-going   06/10/20- progressed     PT LONG TERM GOAL #2   Title Patient to demonstrate cervical AROM WFL and without pain limiting.    Time 6    Period Weeks    Status On-going   Progressing, improved AROM all directions mostly limited by tightness with  L rotation and flexion, no pain.     PT LONG TERM GOAL #3   Title Patient to demonstrate and recall postural correction at rest and with activity for improved postural awareness.    Time 6    Period Weeks    Status Achieved   demonstrates good seated posture     PT LONG TERM GOAL #4   Title Patient to report 75% improved tolerance for lifting and patient transfers at work.    Time 6    Period Weeks    Status On-going   06/10/20 - reports improvement overall                  Plan - 06/10/21 0810     Clinical Impression Statement Patient again reports tightness in R UT/LS area, requesting dry needling since this has had such good effect previously.  Focused on progressing HEP today, for periscapular strengthening and thoracic self- mobilization, as she is very tight across her upper back.  She reported decreased tightness following exercises, and further reduction in pain and tightness following manual therapy today.  She would benefit from continued skilled therapy.    Personal Factors and Comorbidities Comorbidity 2;Past/Current Experience;Profession;Time since onset of injury/illness/exacerbation    Comorbidities anemia, anxiety    PT Frequency 1x / week    PT Duration 6 weeks    PT Treatment/Interventions ADLs/Self Care Home Management;Cryotherapy;Electrical Stimulation;Moist Heat;Traction;Therapeutic exercise;Therapeutic activities;Functional mobility training;Ultrasound;Neuromuscular re-education;Patient/family education;Manual techniques;Taping;Energy conservation;Dry needling;Passive range of motion    PT Next Visit Plan reassess HEP; progress cervical mobility, postural strengthening    Consulted and Agree with Plan of Care Patient             Patient will benefit from skilled therapeutic intervention in order to improve the following deficits and impairments:  Hypomobility, Decreased activity tolerance, Decreased strength, Pain, Impaired UE functional use, Increased  fascial restricitons, Increased muscle spasms, Decreased range of motion, Impaired perceived functional ability, Improper body mechanics, Postural dysfunction, Impaired flexibility  Visit Diagnosis: Cervicalgia  Cramp and spasm  Abnormal posture     Problem List Patient Active Problem List   Diagnosis Date Noted   Cervical strain, acute, subsequent encounter 03/25/2021   Depression, major, single episode, complete remission (HCC) 03/08/2020   GAD (generalized anxiety disorder) 03/08/2020   IUD (intrauterine device) in place 02/28/2019   Normal labor 03/11/2017   Supervision of normal pregnancy 08/31/2016   Hemoglobin C trait (HCC) 01/17/2014   Rh negative state in antepartum period 01/17/2014    Jena Gauss, PT, DPT 06/10/2021, 2:45 PM  [facility] [address]  [address] [place], [place], [address] Phone: [phone]   Fax:  [phone]  Name: Vicki Mcdonald MRN: [id number] Date of Birth: 1995/12/12

## 2021-06-10 NOTE — Patient Instructions (Signed)
Access Code: [email or web address] URL: [email or web address] Date: 06/10/2021 Prepared by: Harrie Foreman  Exercises Sidelying Open Book Thoracic Lumbar Rotation and Extension - 1 x daily - 7 x weekly - 1 sets - 10 reps Sidelying Open Book - 1 x daily - 7 x weekly - 1 sets - 10 reps Wall Angels - 1 x daily - 7 x weekly - 3 sets - 10 reps Seated Thoracic Flexion and Rotation with Arms Crossed - 1 x daily - 7 x weekly - 2 sets - 10 reps

## 2021-06-18 ENCOUNTER — Ambulatory Visit: Payer: Managed Care, Other (non HMO)

## 2021-06-18 ENCOUNTER — Other Ambulatory Visit: Payer: Self-pay

## 2021-06-18 DIAGNOSIS — M542 Cervicalgia: Secondary | ICD-10-CM | POA: Diagnosis not present

## 2021-06-18 DIAGNOSIS — R293 Abnormal posture: Secondary | ICD-10-CM

## 2021-06-18 DIAGNOSIS — R252 Cramp and spasm: Secondary | ICD-10-CM

## 2021-06-18 NOTE — Therapy (Signed)
[facility] [address]  [address] [place], [place], [address] Phone: [phone]   Fax:  [phone]  Physical Therapy Treatment  Patient Details  Name: Vicki Mcdonald MRN: [id number] Date of Birth: 03/17/1996 Referring Provider (PT): Seabron Spates, [place]   Encounter Date: 06/18/2021   PT End of Session - 06/18/21 0847     Visit Number 6    Number of Visits 12    Date for PT Re-Evaluation 07/08/21    Authorization Type [employer]    Progress Note Due on Visit 10    PT Start Time 0805    PT Stop Time 0846    PT Time Calculation (min) 41 min    Activity Tolerance Patient tolerated treatment well    Behavior During Therapy [facility] for tasks assessed/performed             Past Medical History:  Diagnosis Date   Anemia    Anxiety    no meds   Blood transfusion without reported diagnosis 2014   x3   Hemoglobin C trait (HCC)    History of blood transfusion 01/17/2014   X 3 09/2012     Past Surgical History:  Procedure Laterality Date   NO PAST SURGERIES      There were no vitals filed for this visit.   Subjective Assessment - 06/18/21 0809     Subjective Pt reports that she can turn her head more but still feeling stiff in her neck and shoulders.    Pertinent History anemia, anxiety    Diagnostic tests 03/25/21 cervical xray: Mild straightening of the cervical spine. No acute bony abnormality identified.    Patient Stated Goals decrease pain    Currently in Pain? Yes    Pain Score 4     Pain Location Neck    Pain Orientation Right    Pain Descriptors / Indicators Burning    Pain Type Chronic pain                               [facility] Adult PT Treatment/Exercise - 06/18/21 0001       Neck Exercises: Machines for Strengthening   Nustep L5x83min      Neck Exercises: Standing   Other Standing Exercises bird dog 10x3"    Other Standing Exercises standing shld extensions with lat pull bar 10x       Neck Exercises: Stretches   Corner Stretch 3 reps;20 seconds    Corner Stretch Limitations corner pec    Other Neck Stretches standing open books 10x3"      Manual Therapy   Manual Therapy Soft tissue mobilization;Myofascial release    Soft tissue mobilization STM to R UT, LS, teres group, lats    Myofascial Release TPR to R UT, LS, teres group                       PT Short Term Goals - 05/07/21 0850       PT SHORT TERM GOAL #1   Title Patient to be independent with initial HEP.    Time 3    Period Weeks    Status Achieved    Target Date 04/30/21               PT Long Term Goals - 06/10/21 1320       PT LONG TERM GOAL #1   Title Patient  to be independent with advanced HEP.    Time 6    Period Weeks    Status On-going   06/10/20- progressed     PT LONG TERM GOAL #2   Title Patient to demonstrate cervical AROM WFL and without pain limiting.    Time 6    Period Weeks    Status On-going   Progressing, improved AROM all directions mostly limited by tightness with L rotation and flexion, no pain.     PT LONG TERM GOAL #3   Title Patient to demonstrate and recall postural correction at rest and with activity for improved postural awareness.    Time 6    Period Weeks    Status Achieved   demonstrates good seated posture     PT LONG TERM GOAL #4   Title Patient to report 75% improved tolerance for lifting and patient transfers at work.    Time 6    Period Weeks    Status On-going   06/10/20 - reports improvement overall                  Plan - 06/18/21 0847     Clinical Impression Statement Pt still with tightness in her R UT, LS, and teres group today. Improvements made in cervical mobility post MT. Followed with exercises for posture due to her rounded shoulder posture at rest. She shows to be making progress with tissue extensibility of her neck and could use more work on strengthening her periscaps.    Personal Factors and  Comorbidities Comorbidity 2;Past/Current Experience;Profession;Time since onset of injury/illness/exacerbation    Comorbidities anemia, anxiety    PT Frequency 1x / week    PT Duration 6 weeks    PT Treatment/Interventions ADLs/Self Care Home Management;Cryotherapy;Electrical Stimulation;Moist Heat;Traction;Therapeutic exercise;Therapeutic activities;Functional mobility training;Ultrasound;Neuromuscular re-education;Patient/family education;Manual techniques;Taping;Energy conservation;Dry needling;Passive range of motion    PT Next Visit Plan reassess HEP; progress cervical mobility, postural strengthening    Consulted and Agree with Plan of Care Patient             Patient will benefit from skilled therapeutic intervention in order to improve the following deficits and impairments:  Hypomobility, Decreased activity tolerance, Decreased strength, Pain, Impaired UE functional use, Increased fascial restricitons, Increased muscle spasms, Decreased range of motion, Impaired perceived functional ability, Improper body mechanics, Postural dysfunction, Impaired flexibility  Visit Diagnosis: Cervicalgia  Cramp and spasm  Abnormal posture     Problem List Patient Active Problem List   Diagnosis Date Noted   Cervical strain, acute, subsequent encounter 03/25/2021   Depression, major, single episode, complete remission (HCC) 03/08/2020   GAD (generalized anxiety disorder) 03/08/2020   IUD (intrauterine device) in place 02/28/2019   Normal labor 03/11/2017   Supervision of normal pregnancy 08/31/2016   Hemoglobin C trait (HCC) 01/17/2014   Rh negative state in antepartum period 01/17/2014    Darleene Cleaver, PTA 06/18/2021, 9:29 AM  [facility] [address]  [address] [place], [place], [address] Phone: [phone]   Fax:  [phone]  Name: Kateena Degroote MRN: [id number] Date of Birth: [birth year]

## 2021-06-25 ENCOUNTER — Other Ambulatory Visit: Payer: Self-pay

## 2021-06-25 ENCOUNTER — Encounter: Payer: Self-pay | Admitting: Physical Therapy

## 2021-06-25 ENCOUNTER — Ambulatory Visit: Payer: Managed Care, Other (non HMO) | Attending: Family Medicine | Admitting: Physical Therapy

## 2021-06-25 DIAGNOSIS — M542 Cervicalgia: Secondary | ICD-10-CM | POA: Diagnosis not present

## 2021-06-25 DIAGNOSIS — R252 Cramp and spasm: Secondary | ICD-10-CM | POA: Insufficient documentation

## 2021-06-25 DIAGNOSIS — R293 Abnormal posture: Secondary | ICD-10-CM | POA: Diagnosis present

## 2021-06-25 NOTE — Therapy (Signed)
[facility] [address]  [address] [place], [place], [address] Phone: [phone]   Fax:  [phone]  Physical Therapy Treatment  Patient Details  Name: Vicki Mcdonald MRN: [id number] Date of Birth: 1995/12/21 Referring Provider (PT): Seabron Spates, [place]   Encounter Date: 06/25/2021   PT End of Session - 06/25/21 0856     Visit Number 7    Number of Visits 12    Date for PT Re-Evaluation 07/08/21    Authorization Type [employer]    Progress Note Due on Visit 10    PT Start Time 0806    PT Stop Time 0850    PT Time Calculation (min) 44 min    Activity Tolerance Patient tolerated treatment well    Behavior During Therapy [facility] for tasks assessed/performed             Past Medical History:  Diagnosis Date   Anemia    Anxiety    no meds   Blood transfusion without reported diagnosis 2014   x3   Hemoglobin C trait (HCC)    History of blood transfusion 01/17/2014   X 3 09/2012     Past Surgical History:  Procedure Laterality Date   NO PAST SURGERIES      There were no vitals filed for this visit.   Subjective Assessment - 06/25/21 0813     Subjective Pt. reports having a very bad weekend and having an allergic reaction to ant bites.    Pertinent History anemia, anxiety    Diagnostic tests 03/25/21 cervical xray: Mild straightening of the cervical spine. No acute bony abnormality identified.    Patient Stated Goals decrease pain    Currently in Pain? Yes    Pain Score 0-No pain    Pain Location Neck    Pain Orientation Right;Left    Pain Descriptors / Indicators Tightness                               [facility] Adult PT Treatment/Exercise - 06/25/21 0001       Neck Exercises: Machines for Strengthening   Nustep L5x15min      Manual Therapy   Manual Therapy Joint mobilization;Soft tissue mobilization;Myofascial release;Other (comment)    Manual therapy comments noted palpable trigger  points throughout bil UT and Levator    Joint Mobilization PA mobs C3-C6, T1-T4, NAGs into rotations    Soft tissue mobilization STM to bil cervical paraspinals, UT, levator    Myofascial Release TPR to bil UT and levator    Other Manual Therapy dry needling              Trigger Point Dry Needling - 06/25/21 0001     Consent Given? Yes    Education Handout Provided Previously provided    Muscles Treated Head and Neck Upper trapezius;Levator scapulae;Cervical multifidi   bil UT, levator, C5-6 bil   Dry Needling Comments patient in prone    Upper Trapezius Response Twitch reponse elicited;Palpable increased muscle length    Levator Scapulae Response Twitch response elicited;Palpable increased muscle length    Cervical multifidi Response Twitch reponse elicited;Palpable increased muscle length                   PT Education - 06/25/21 0856     Education Details continue to perform HEP and stretches    Person(s) Educated Patient    Methods Explanation;Demonstration  Comprehension Verbalized understanding;Returned demonstration              PT Short Term Goals - 05/07/21 0850       PT SHORT TERM GOAL #1   Title Patient to be independent with initial HEP.    Time 3    Period Weeks    Status Achieved    Target Date 04/30/21               PT Long Term Goals - 06/25/21 0900       PT LONG TERM GOAL #1   Title Patient to be independent with advanced HEP.    Time 6    Period Weeks    Status On-going   06/10/20- progressed   Target Date 07/08/21      PT LONG TERM GOAL #2   Title Patient to demonstrate cervical AROM WFL and without pain limiting.    Time 6    Period Weeks    Status On-going   Progressing, improved AROM all directions mostly limited by tightness with L rotation and flexion, no pain.     PT LONG TERM GOAL #3   Title Patient to demonstrate and recall postural correction at rest and with activity for improved postural awareness.    Time  6    Period Weeks    Status Achieved   demonstrates good seated posture   Target Date 07/08/21      PT LONG TERM GOAL #4   Title Patient to report 75% improved tolerance for lifting and patient transfers at work.    Time 6    Period Weeks    Status On-going   06/10/20 - reports improvement overall   Target Date 07/08/21                   Plan - 06/25/21 0857     Clinical Impression Statement Pt. reports good compliance with HEP and attending to posture throughout day.  She reports increased tightness in both upper shoulders today following stressful weekend and episode of anaphylaxis from ant bites.  She had significant trigger points throughout both UT, levator and cervical multifidis, but responded very well to manual therapy with decreased tightness and discomfort.  Reviewed HEP briefly, she feels the levator stretches are very helpful.  She would benefit from continued skilled therapy to improve postural strengthening and decrease recurrance of neck pain.    Personal Factors and Comorbidities Comorbidity 2;Past/Current Experience;Profession;Time since onset of injury/illness/exacerbation    Comorbidities anemia, anxiety    PT Frequency 1x / week    PT Duration 6 weeks    PT Treatment/Interventions ADLs/Self Care Home Management;Cryotherapy;Electrical Stimulation;Moist Heat;Traction;Therapeutic exercise;Therapeutic activities;Functional mobility training;Ultrasound;Neuromuscular re-education;Patient/family education;Manual techniques;Taping;Energy conservation;Dry needling;Passive range of motion    PT Next Visit Plan reassess HEP; progress cervical mobility, postural strengthening    Consulted and Agree with Plan of Care Patient             Patient will benefit from skilled therapeutic intervention in order to improve the following deficits and impairments:  Hypomobility, Decreased activity tolerance, Decreased strength, Pain, Impaired UE functional use, Increased fascial  restricitons, Increased muscle spasms, Decreased range of motion, Impaired perceived functional ability, Improper body mechanics, Postural dysfunction, Impaired flexibility  Visit Diagnosis: Cervicalgia  Cramp and spasm  Abnormal posture     Problem List Patient Active Problem List   Diagnosis Date Noted   Cervical strain, acute, subsequent encounter 03/25/2021   Depression, major, single episode, complete remission (HCC)  03/08/2020   GAD (generalized anxiety disorder) 03/08/2020   IUD (intrauterine device) in place 02/28/2019   Normal labor 03/11/2017   Supervision of normal pregnancy 08/31/2016   Hemoglobin C trait (HCC) 01/17/2014   Rh negative state in antepartum period 01/17/2014    Jena Gauss, PT, DPT 06/25/2021, 9:02 AM  [facility] [address]  [address] [place], [place], [address] Phone: [phone]   Fax:  [phone]  Name: Vicki Mcdonald MRN: [id number] Date of Birth: December 06, 1995

## 2021-07-08 ENCOUNTER — Other Ambulatory Visit: Payer: Self-pay

## 2021-07-08 ENCOUNTER — Ambulatory Visit: Payer: Managed Care, Other (non HMO)

## 2021-07-08 DIAGNOSIS — M542 Cervicalgia: Secondary | ICD-10-CM

## 2021-07-08 DIAGNOSIS — R252 Cramp and spasm: Secondary | ICD-10-CM

## 2021-07-08 DIAGNOSIS — R293 Abnormal posture: Secondary | ICD-10-CM

## 2021-07-08 NOTE — Therapy (Addendum)
PHYSICAL THERAPY DISCHARGE SUMMARY (08/11/2021)  Visits from Start of Care: 8  Current functional level related to goals / functional outcomes: See note below.  "FOTO score has improved from 55% to 63% this session. Cervical AROM has improved but she noted some pulling with all directions. 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     Education / Equipment: HEP  Plan: Patient agrees to discharge.  Patient is being discharged due to meeting the stated rehab goals.  Patient transitioned to HEP and was placed on 30 day hold on 07/08/21 and did not return within that time period.    Rennie Natter, PT, DPT 08/11/2021   [facility] [address]  [place], [place], [address] Phone: [phone]   Fax:  [phone]  Physical Therapy Treatment  Patient Details  Name: Vicki Mcdonald MRN: [id number] Date of Birth: [birth year] Referring Provider (PT): Roma Schanz, [place]   Encounter Date: 07/08/2021   PT End of Session - 07/08/21 1054     Visit Number 8    Number of Visits 12    Date for PT Re-Evaluation 07/08/21    Authorization Type [employer]    Progress Note Due on Visit 10    PT Start Time 0931    PT Stop Time 1018    PT Time Calculation (min) 47 min    Activity Tolerance Patient tolerated treatment well    Behavior During Therapy [facility] for tasks assessed/performed             Past Medical History:  Diagnosis Date   Anemia    Anxiety    no meds   Blood transfusion without reported diagnosis 2014   x3   Hemoglobin C trait ([place])    History of blood transfusion 01/17/2014   X 3 09/2012     Past Surgical History:  Procedure Laterality Date   NO PAST SURGERIES      There were no vitals filed for this visit.   Subjective Assessment - 07/08/21 0933     Subjective Notes that her neck is feeling much better than before.    Pertinent  History anemia, anxiety    Diagnostic tests 03/25/21 cervical xray: Mild straightening of the cervical spine. No acute bony abnormality identified.    Patient Stated Goals decrease pain    Currently in Pain? Yes    Pain Score 3     Pain Location Neck    Pain Orientation Right;Left    Pain Descriptors / Indicators Tightness    Pain Type Chronic pain                [facility] PT Assessment - 07/08/21 0001       Observation/Other Assessments   Focus on Therapeutic Outcomes (FOTO)  Neck: 63; predicted score 71      AROM   Cervical Flexion 47    Cervical Extension 78    Cervical - Right Side Bend 41    Cervical - Left Side Bend 50    Cervical - Right Rotation 54    Cervical - Left Rotation 60                           [facility] Adult PT Treatment/Exercise - 07/08/21 0001       Neck Exercises: Machines for Strengthening   UBE (Upper Arm Bike) L3x 6 min (13f  3 retro)      Manual Therapy   Manual Therapy Soft tissue mobilization;Myofascial release;Other (comment)    Manual therapy comments noted palpable trigger points throughout bil UT and Levator    Soft tissue mobilization STM to R UT/Levator    Myofascial Release TPR to R levator (pin and stretch) - patient instructed in technique    Other Manual Therapy dry needling              Trigger Point Dry Needling - 07/08/21 0001     Consent Given? Yes    Education Handout Provided Previously provided    Muscles Treated Head and Neck Upper trapezius;Levator scapulae   Right side   Dry Needling Comments patient in prone    Upper Trapezius Response Twitch reponse elicited;Palpable increased muscle length    Levator Scapulae Response Twitch response elicited;Palpable increased muscle length            Manual therapy and Dry needling performed by Rennie Natter, PT, DPT          PT Short Term Goals - 05/07/21 0850       PT SHORT TERM GOAL #1   Title Patient to be independent with initial HEP.     Time 3    Period Weeks    Status Achieved    Target Date 04/30/21               PT Long Term Goals - 07/08/21 0949       PT LONG TERM GOAL #1   Title Patient to be independent with advanced HEP.    Time 6    Period Weeks    Status On-going   06/10/20- progressed     PT LONG TERM GOAL #2   Title Patient to demonstrate cervical AROM WFL and without pain limiting.    Time 6    Period Weeks    Status Partially Met   AROM has improved but she notes mild pulling in all directions     PT LONG TERM GOAL #3   Title Patient to demonstrate and recall postural correction at rest and with activity for improved postural awareness.    Time 6    Period Weeks    Status Achieved   demonstrates good seated posture     PT LONG TERM GOAL #4   Title Patient to report 75% improved tolerance for lifting and patient transfers at work.    Time 6    Period Weeks    Status On-going   07/08/21 - 50% improvement                  Plan - 07/08/21 1015     Clinical Impression Statement Pt noted that she was ready to transition to HEP after todays session. FOTO score has improved from 55% to 63% this session. Cervical AROM has improved but she noted some pulling with all directions. 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 She denied questions with HEP and denied needing an update. Ended session having her supervising PT to do DN per pt request. Pt wil be placed on 30 day hold after todays session.    Personal Factors and Comorbidities Comorbidity 2;Past/Current Experience;Profession;Time since onset of injury/illness/exacerbation    Comorbidities anemia, anxiety    PT Frequency 1x / week    PT Duration 6 weeks    PT Treatment/Interventions ADLs/Self Care Home Management;Cryotherapy;Electrical Stimulation;Moist Heat;Traction;Therapeutic exercise;Therapeutic activities;Functional mobility training;Ultrasound;Neuromuscular re-education;Patient/family  education;Manual techniques;Taping;Energy  conservation;Dry needling;Passive range of motion    PT Next Visit Plan 30 day hold    Consulted and Agree with Plan of Care Patient             Patient will benefit from skilled therapeutic intervention in order to improve the following deficits and impairments:  Hypomobility, Decreased activity tolerance, Decreased strength, Pain, Impaired UE functional use, Increased fascial restricitons, Increased muscle spasms, Decreased range of motion, Impaired perceived functional ability, Improper body mechanics, Postural dysfunction, Impaired flexibility  Visit Diagnosis: Cervicalgia  Cramp and spasm  Abnormal posture     Problem List Patient Active Problem List   Diagnosis Date Noted   Cervical strain, acute, subsequent encounter 03/25/2021   Depression, major, single episode, complete remission ([place]) 03/08/2020   GAD (generalized anxiety disorder) 03/08/2020   IUD (intrauterine device) in place 02/28/2019   Normal labor 03/11/2017   Supervision of normal pregnancy 08/31/2016   Hemoglobin C trait ([place]) 01/17/2014   Rh negative state in antepartum period 01/17/2014    Artist Pais, PTA 07/08/2021, 11:08 AM  Rennie Natter, PT, DPT  [facility] [address]  [place], [place], [address] Phone: [phone]   Fax:  [phone]  Name: Nura Cahoon MRN: [id number] Date of Birth: 08-21-1996

## 2022-03-15 IMAGING — DX DG CERVICAL SPINE COMPLETE 4+V
5 series · 5 of 5 positions shown · non-contrast
Comparison: No prior.

CLINICAL DATA: Right lateral neck pain.

EXAM:
CERVICAL SPINE - COMPLETE 4+ VIEW

[c-spine lat]
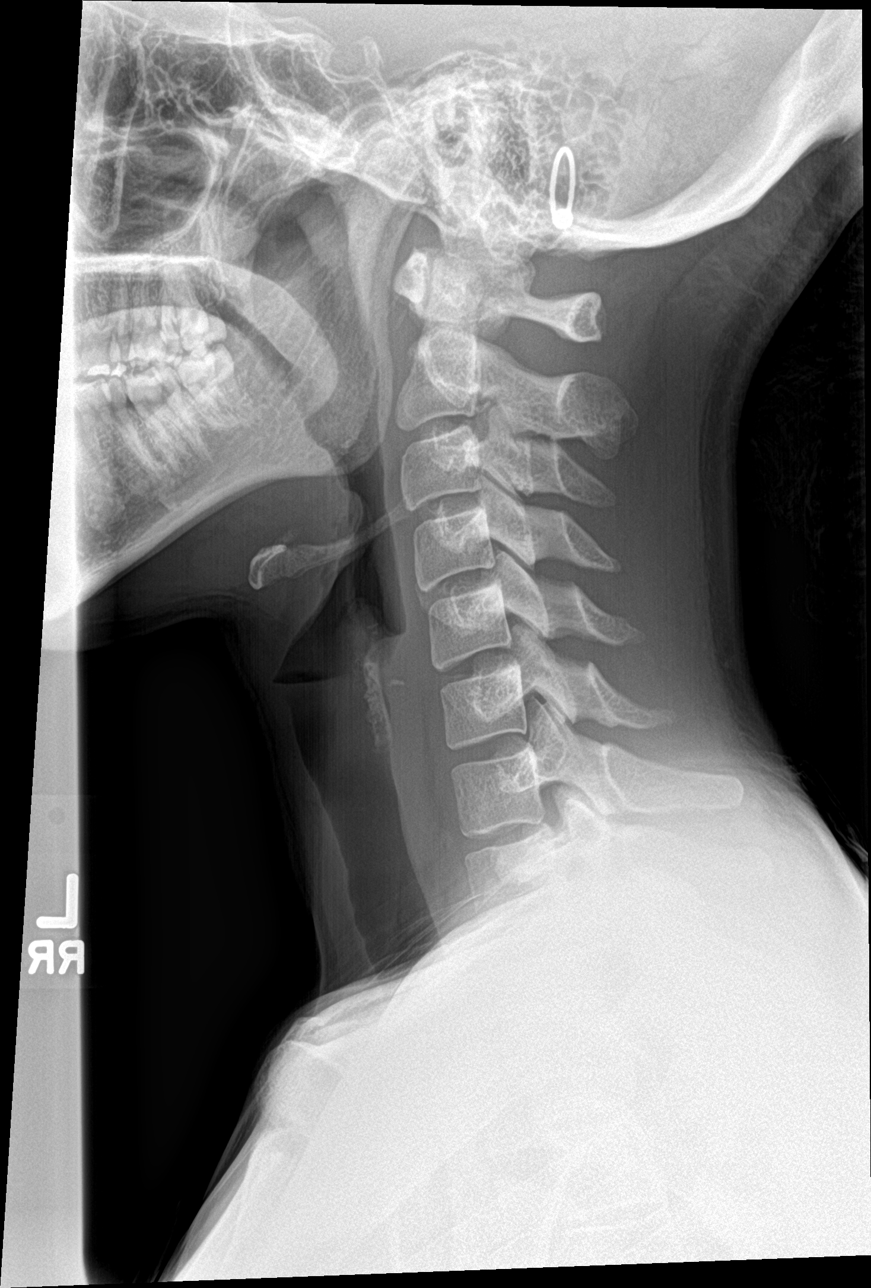

[c-spine obl (1 of 2)]
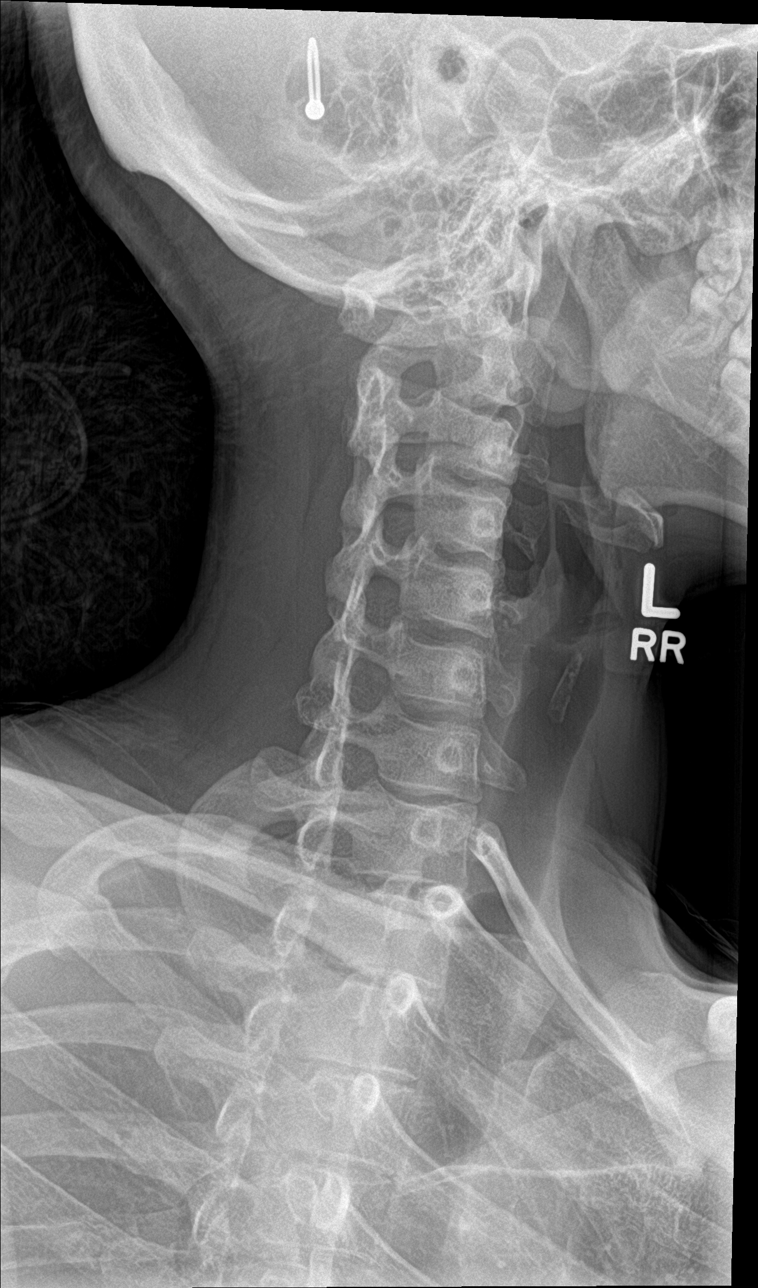

[c-spine obl (2 of 2)]
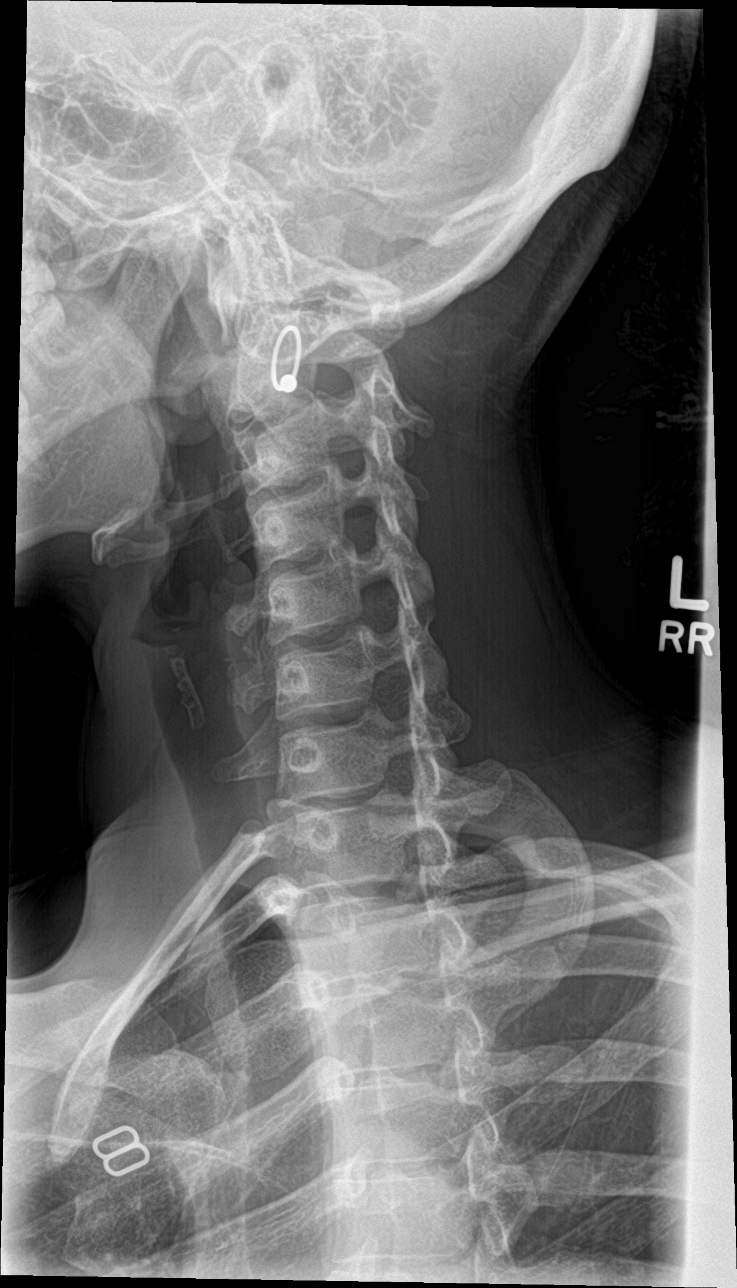

[c-spine ap]
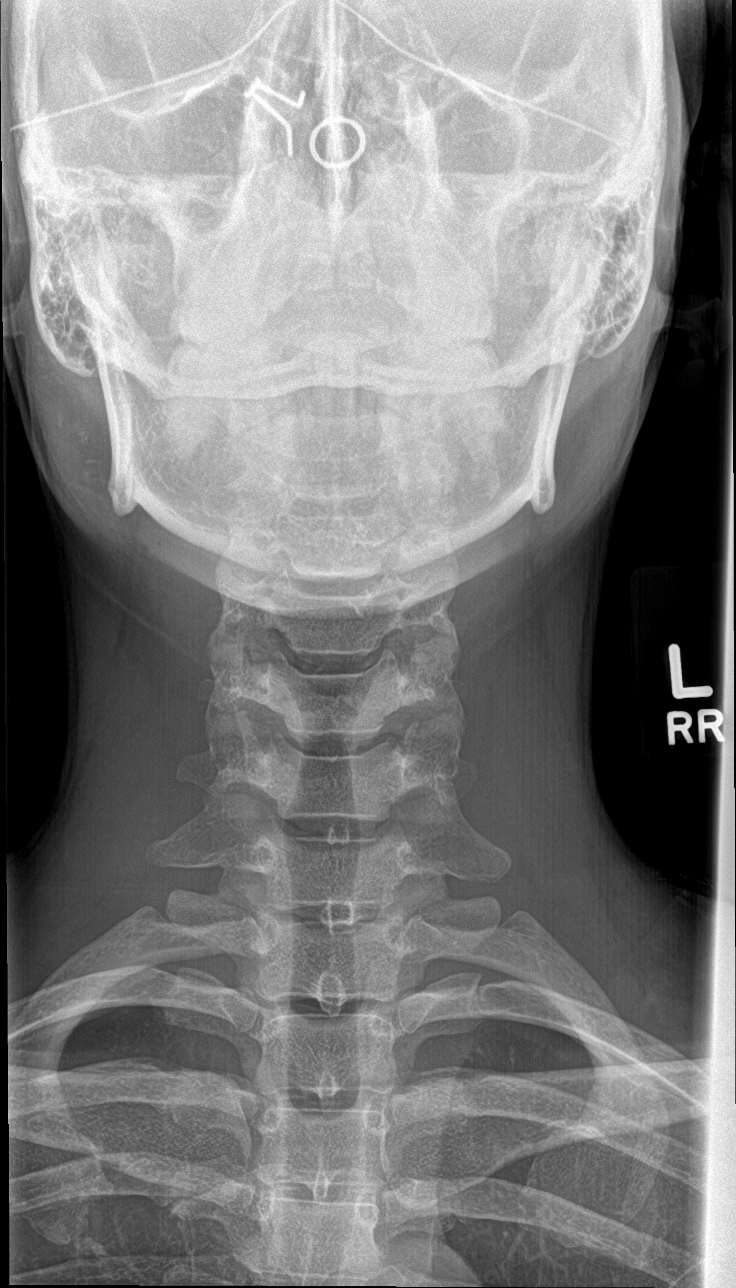

[c-spine open mouth]
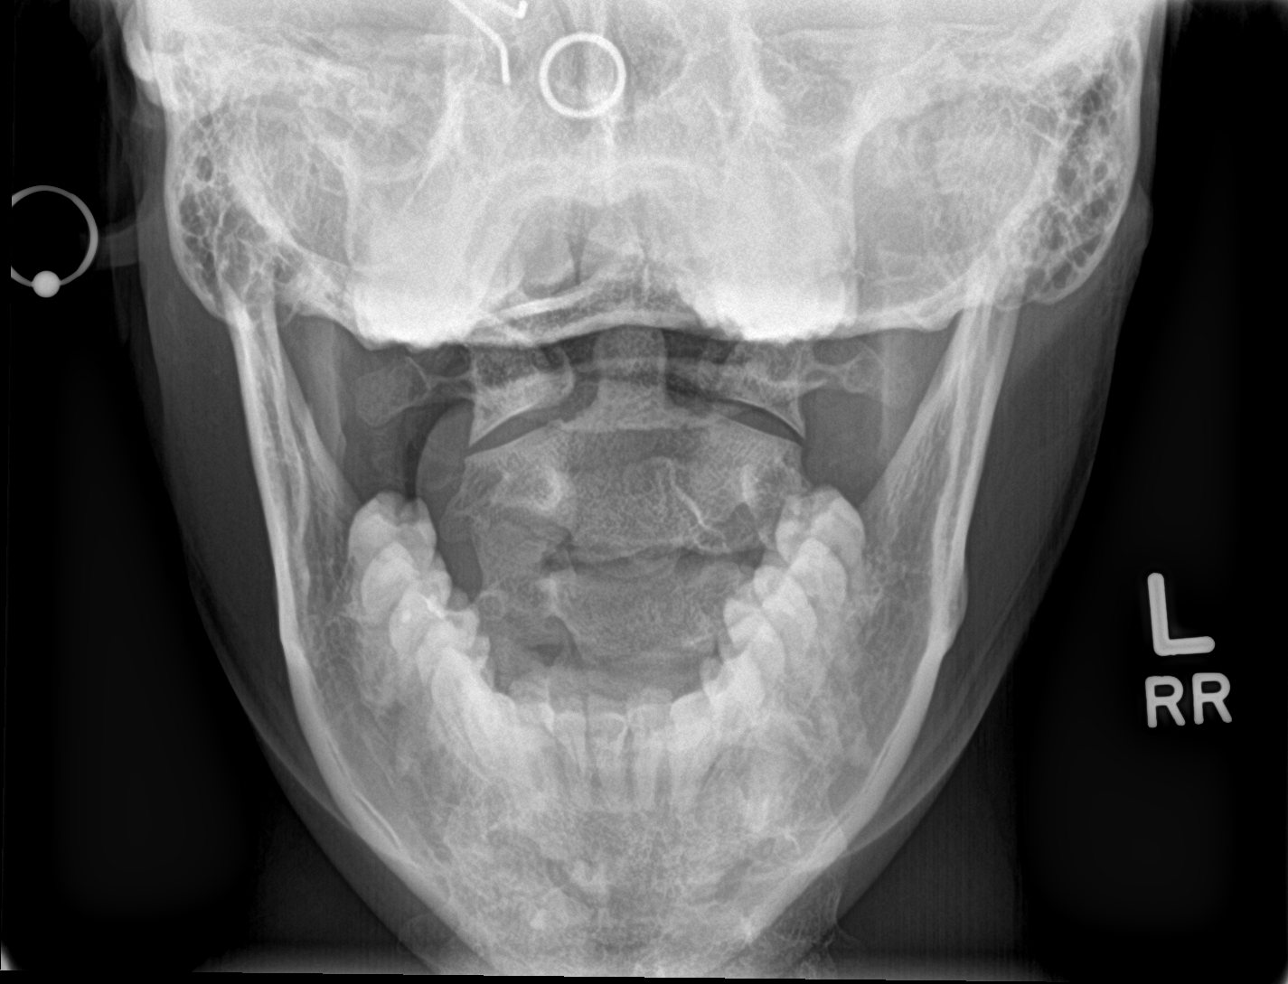

[5 of 5 positions shown; findings below may reference images not displayed]

FINDINGS: Mild straightening of the cervical spine. No acute soft tissue bony
abnormality. No evidence of fracture dislocation. Pulmonary apices
are clear.
IMPRESSION: Mild straightening of the cervical spine. No acute bony abnormality
identified.

## 2022-04-27 ENCOUNTER — Ambulatory Visit (INDEPENDENT_AMBULATORY_CARE_PROVIDER_SITE_OTHER): Payer: Managed Care, Other (non HMO) | Admitting: Family Medicine

## 2022-04-27 ENCOUNTER — Encounter: Payer: Self-pay | Admitting: Family Medicine

## 2022-04-27 ENCOUNTER — Ambulatory Visit (HOSPITAL_BASED_OUTPATIENT_CLINIC_OR_DEPARTMENT_OTHER)
Admission: RE | Admit: 2022-04-27 | Discharge: 2022-04-27 | Disposition: A | Discharge status: Home / Self Care | Payer: Managed Care, Other (non HMO) | Source: Ambulatory Visit | Attending: Family Medicine | Admitting: Family Medicine

## 2022-04-27 VITALS — BP 108/66 | HR 84 | Ht 66.0 in | Wt 159.8 lb

## 2022-04-27 DIAGNOSIS — M545 Low back pain, unspecified: Secondary | ICD-10-CM | POA: Insufficient documentation

## 2022-04-27 DIAGNOSIS — W19XXXA Unspecified fall, initial encounter: Secondary | ICD-10-CM

## 2022-04-27 MED ORDER — CYCLOBENZAPRINE HCL 5 MG PO TABS: 5.0000 mg | ORAL_TABLET | Freq: Three times a day (TID) | ORAL | 1 refills | Status: DC | PRN

## 2022-04-27 MED ORDER — MELOXICAM 15 MG PO TABS: 15.0000 mg | ORAL_TABLET | Freq: Every day | ORAL | 0 refills | Status: DC

## 2022-04-27 NOTE — Patient Instructions (Addendum)
Given that you are still having significant pain from the fall and some tingling sensations in your right foot, let's start with xrays.  Adding Meloxicam for pain and inflammation - do not take with any other antiinflammatories like ibuprofen or Aleve. It is okay to take Tylenol while you are on this.  Adding a muscle relaxer for spasms - this might make you drowsy. We will update you with xray results and any change to plan.  If you develop any new or alarming symptoms (numbness to private area, loss of control of bladder/bowel, severe shooting pain, new numbness/weakness, etc then go to the emergency department for advanced workup.  Otherwise, take meds described above as needed and try heating pad, rest, stretches. Work note provided for this week.  Please contact office for follow-up if symptoms do not improve or worsen. Seek emergency care if symptoms become severe.

## 2022-04-27 NOTE — Progress Notes (Signed)
Acute Office Visit  Subjective:     Patient ID: Vicki Mcdonald, female    DOB: [birth year], 26 y.o.   MRN: [id number]  CC:    Back Pain This is a new problem. The current episode started in the past 7 days. The problem occurs constantly. The problem is unchanged. The pain is present in the lumbar spine and gluteal. The quality of the pain is described as aching. The pain radiates to the right foot. The pain is at a severity of 4/10. The pain is The same all the time. The symptoms are aggravated by sitting, position, twisting, bending and coughing. Associated symptoms include paresthesias. Pertinent negatives include no abdominal pain, bladder incontinence, bowel incontinence, chest pain, dysuria, fever, headaches, leg pain, numbness, paresis, pelvic pain, perianal numbness, tingling, weakness or weight loss. (Right foot with occasional numbness/tingling, but not shooting down entire leg) She has tried NSAIDs (lidocaine patch) for the symptoms. The treatment provided no relief.   (Fell skim boarding last week and landed on tail bone last Wednesday.)      ROS:  A comprehensive ROS was completed and negative except as noted per HPI       Objective:    BP 108/66   Pulse 84   Ht 5\' 6"  (1.676 m)   Wt 159 lb 12.8 oz (72.5 kg)   LMP 04/20/2022   BMI 25.79 kg/m    Physical Exam Vitals reviewed.  Constitutional:      Appearance: Normal appearance.  Musculoskeletal:        General: No swelling. Normal range of motion.     Comments: Tenderness to palpation of lumbar paraspinal muscles and sacrum/coccyx   Skin:    General: Skin is warm and dry.     Findings: No bruising.  Neurological:     General: No focal deficit present.     Mental Status: She is alert and oriented to person, place, and time. Mental status is at baseline.     Cranial Nerves: No cranial nerve deficit.     Sensory: No sensory deficit.     Motor: No weakness.     Gait: Gait normal.  Psychiatric:        Mood and  Affect: Mood normal.        Behavior: Behavior normal.        Thought Content: Thought content normal.        Judgment: Judgment normal.     No results found for any visits on 04/27/22.      Assessment & Plan:    1. Acute midline low back pain without sciatica 2. Fall, initial encounter Given that you are still having significant pain from the fall and some tingling sensations in your right foot, let's start with xrays.  Adding Meloxicam for pain and inflammation - do not take with any other antiinflammatories like ibuprofen or Aleve. It is okay to take Tylenol while you are on this.  Adding a muscle relaxer for spasms - this might make you drowsy. We will update you with xray results and any change to plan.  If you develop any new or alarming symptoms (numbness to private area, loss of control of bladder/bowel, severe shooting pain, new numbness/weakness, etc then go to the emergency department for advanced workup.  Otherwise, take meds described above as needed and try heating pad, rest, stretches. Work note provided for this week.  Please contact office for follow-up if symptoms do not improve or worsen. Seek emergency care if symptoms  become severe.     - DG Sacrum/Coccyx; Future - DG Lumbar Spine Complete; Future - cyclobenzaprine (FLEXERIL) 5 MG tablet; Take 1 tablet (5 mg total) by mouth 3 (three) times daily as needed for muscle spasms.  Dispense: 30 tablet; Refill: 1 - meloxicam (MOBIC) 15 MG tablet; Take 1 tablet (15 mg total) by mouth daily.  Dispense: 30 tablet; Refill: 0     Return if symptoms worsen or fail to improve.  Clayborne Dana, NP

## 2022-04-27 NOTE — Progress Notes (Signed)
Larey Seat last Wed Hurts around tailbone

## 2022-04-28 ENCOUNTER — Encounter: Payer: Self-pay | Admitting: Family Medicine

## 2022-05-24 ENCOUNTER — Other Ambulatory Visit: Payer: Self-pay | Admitting: Family Medicine

## 2022-05-24 DIAGNOSIS — M545 Low back pain, unspecified: Secondary | ICD-10-CM

## 2022-05-24 DIAGNOSIS — W19XXXA Unspecified fall, initial encounter: Secondary | ICD-10-CM

## 2022-10-07 ENCOUNTER — Encounter: Payer: Self-pay | Admitting: Family Medicine

## 2022-10-07 ENCOUNTER — Ambulatory Visit: Payer: Managed Care, Other (non HMO) | Admitting: Family Medicine

## 2022-10-07 VITALS — BP 108/60 | HR 76 | Temp 98.1°F | Ht 66.0 in | Wt 154.2 lb

## 2022-10-07 DIAGNOSIS — R519 Headache, unspecified: Secondary | ICD-10-CM

## 2022-10-07 DIAGNOSIS — G8929 Other chronic pain: Secondary | ICD-10-CM | POA: Diagnosis not present

## 2022-10-07 DIAGNOSIS — R413 Other amnesia: Secondary | ICD-10-CM | POA: Diagnosis not present

## 2022-10-07 MED ORDER — AMITRIPTYLINE HCL 10 MG PO TABS
10.0000 mg | ORAL_TABLET | Freq: Every day | ORAL | 1 refills | Status: DC
Start: 2022-10-07 — End: 2024-02-23

## 2022-10-07 MED ORDER — RIZATRIPTAN BENZOATE 10 MG PO TABS: 10.0000 mg | ORAL_TABLET | ORAL | 0 refills | Status: DC | PRN

## 2022-10-07 NOTE — Patient Instructions (Signed)
We will be in touch regarding your MRI.   Heat (pad or rice pillow in microwave) over affected area, 10-15 minutes twice daily.   Ice/cold pack over area for 10-15 min twice daily.  OK to take Tylenol 1000 mg (2 extra strength tabs) or 975 mg (3 regular strength tabs) every 6 hours as needed.  Let us know if you need anything.  Trapezius stretches/exercises Do exercises exactly as told by your health care provider and adjust them as directed. It is normal to feel mild stretching, pulling, tightness, or discomfort as you do these exercises, but you should stop right away if you feel sudden pain or your pain gets worse.   Stretching and range of motion exercises These exercises warm up your muscles and joints and improve the movement and flexibility of your shoulder. These exercises can also help to relieve pain, numbness, and tingling. If you are unable to do any of the following for any reason, do not further attempt to do it.   Exercise A: Flexion, standing     Stand and hold a broomstick, a cane, or a similar object. Place your hands a little more than shoulder-width apart on the object. Your left / right hand should be palm-up, and your other hand should be palm-down. Push the stick to raise your left / right arm out to your side and then over your head. Use your other hand to help move the stick. Stop when you feel a stretch in your shoulder, or when you reach the angle that is recommended by your health care provider. Avoid shrugging your shoulder while you raise your arm. Keep your shoulder blade tucked down toward your spine. Hold for 30 seconds. Slowly return to the starting position. Repeat 2 times. Complete this exercise 3 times per week.  Exercise B: Abduction, supine     Lie on your back and hold a broomstick, a cane, or a similar object. Place your hands a little more than shoulder-width apart on the object. Your left / right hand should be palm-up, and your other hand  should be palm-down. Push the stick to raise your left / right arm out to your side and then over your head. Use your other hand to help move the stick. Stop when you feel a stretch in your shoulder, or when you reach the angle that is recommended by your health care provider. Avoid shrugging your shoulder while you raise your arm. Keep your shoulder blade tucked down toward your spine. Hold for 30 seconds. Slowly return to the starting position. Repeat 2 times. Complete this exercise 3 times per week.  Exercise C: Flexion, active-assisted     Lie on your back. You may bend your knees for comfort. Hold a broomstick, a cane, or a similar object. Place your hands about shoulder-width apart on the object. Your palms should face toward your feet. Raise the stick and move your arms over your head and behind your head, toward the floor. Use your healthy arm to help your left / right arm move farther. Stop when you feel a gentle stretch in your shoulder, or when you reach the angle where your health care provider tells you to stop. Hold for 30 seconds. Slowly return to the starting position. Repeat 2 times. Complete this exercise 3 times per week.  Exercise D: External rotation and abduction     Stand in a door frame with one of your feet slightly in front of the other. This is called a  staggered stance. Choose one of the following positions as told by your health care provider: Place your hands and forearms on the door frame above your head. Place your hands and forearms on the door frame at the height of your head. Place your hands on the door frame at the height of your elbows. Slowly move your weight onto your front foot until you feel a stretch across your chest and in the front of your shoulders. Keep your head and chest upright and keep your abdominal muscles tight. Hold for 30 seconds. To release the stretch, shift your weight to your back foot. Repeat 2 times. Complete this stretch 3  times per week.  Strengthening exercises These exercises build strength and endurance in your shoulder. Endurance is the ability to use your muscles for a long time, even after your muscles get tired. Exercise E: Scapular depression and adduction  Sit on a stable chair. Support your arms in front of you with pillows, armrests, or a tabletop. Keep your elbows in line with the sides of your body. Gently move your shoulder blades down toward your middle back. Relax the muscles on the tops of your shoulders and in the back of your neck. Hold for 3 seconds. Slowly release the tension and relax your muscles completely before doing this exercise again. Repeat for a total of 10 repetitions. After you have practiced this exercise, try doing the exercise without the arm support. Then, try the exercise while standing instead of sitting. Repeat 2 times. Complete this exercise 3 times per week.  Exercise F: Shoulder abduction, isometric     Stand or sit about 4-6 inches (10-15 cm) from a wall with your left / right side facing the wall. Bend your left / right elbow and gently press your elbow against the wall. Increase the pressure slowly until you are pressing as hard as you can without shrugging your shoulder. Hold for 3 seconds. Slowly release the tension and relax your muscles completely. Repeat for a total of 10 repetitions. Repeat 2 times. Complete this exercise 3 times per week.  Exercise G: Shoulder flexion, isometric     Stand or sit about 4-6 inches (10-15 cm) away from a wall with your left / right side facing the wall. Keep your left / right elbow straight and gently press the top of your fist against the wall. Increase the pressure slowly until you are pressing as hard as you can without shrugging your shoulder. Hold for 10-15 seconds. Slowly release the tension and relax your muscles completely. Repeat for a total of 10 repetitions. Repeat 2 times. Complete this exercise 3 times per  week.  Exercise H: Internal rotation     Sit in a stable chair without armrests, or stand. Secure an exercise band at your left / right side, at elbow height. Place a soft object, such as a folded towel or a small pillow, under your left / right upper arm so your elbow is a few inches (about 8 cm) away from your side. Hold the end of the exercise band so the band stretches. Keeping your elbow pressed against the soft object under your arm, move your forearm across your body toward your abdomen. Keep your body steady so the movement is only coming from your shoulder. Hold for 3 seconds. Slowly return to the starting position. Repeat for a total of 10 repetitions. Repeat 2 times. Complete this exercise 3 times per week.  Exercise I: External rotation     Sit  in a stable chair without armrests, or stand. Secure an exercise band at your left / right side, at elbow height. Place a soft object, such as a folded towel or a small pillow, under your left / right upper arm so your elbow is a few inches (about 8 cm) away from your side. Hold the end of the exercise band so the band stretches. Keeping your elbow pressed against the soft object under your arm, move your forearm out, away from your abdomen. Keep your body steady so the movement is only coming from your shoulder. Hold for 3 seconds. Slowly return to the starting position. Repeat for a total of 10 repetitions. Repeat 2 times. Complete this exercise 3 times per week. Exercise J: Shoulder extension  Sit in a stable chair without armrests, or stand. Secure an exercise band to a stable object in front of you so the band is at shoulder height. Hold one end of the exercise band in each hand. Your palms should face each other. Straighten your elbows and lift your hands up to shoulder height. Step back, away from the secured end of the exercise band, until the band stretches. Squeeze your shoulder blades together and pull your hands down to the  sides of your thighs. Stop when your hands are straight down by your sides. Do not let your hands go behind your body. Hold for 3 seconds. Slowly return to the starting position. Repeat for a total of 10 repetitions. Repeat 2 times. Complete this exercise 3 times per week.  Exercise K: Shoulder extension, prone     Lie on your abdomen on a firm surface so your left / right arm hangs over the edge. Hold a 5 lb weight in your hand so your palm faces in toward your body. Your arm should be straight. Squeeze your shoulder blade down toward the middle of your back. Slowly raise your arm behind you, up to the height of the surface that you are lying on. Keep your arm straight. Hold for 3 seconds. Slowly return to the starting position and relax your muscles. Repeat for a total of 10 repetitions. Repeat 2 times. Complete this exercise 3 times per week.   Exercise L: Horizontal abduction, prone  Lie on your abdomen on a firm surface so your left / right arm hangs over the edge. Hold a 5 lb weight in your hand so your palm faces toward your feet. Your arm should be straight. Squeeze your shoulder blade down toward the middle of your back. Bend your elbow so your hand moves up, until your elbow is bent to an "L" shape (90 degrees). With your elbow bent, slowly move your forearm forward and up. Raise your hand up to the height of the surface that you are lying on. Your upper arm should not move, and your elbow should stay bent. At the top of the movement, your palm should face the floor. Hold for 3 seconds. Slowly return to the starting position and relax your muscles. Repeat for a total of 10 repetitions. Repeat 2 times. Complete this exercise 3 times per week.  Exercise M: Horizontal abduction, standing  Sit on a stable chair, or stand. Secure an exercise band to a stable object in front of you so the band is at shoulder height. Hold one end of the exercise band in each hand. Straighten your  elbows and lift your hands straight in front of you, up to shoulder height. Your palms should face down, toward the  floor. Step back, away from the secured end of the exercise band, until the band stretches. Move your arms out to your sides, and keep your arms straight. Hold for 3 seconds. Slowly return to the starting position. Repeat for a total of 10 repetitions. Repeat 2 times. Complete this exercise 3 times per week.  Exercise N: Scapular retraction and elevation  Sit on a stable chair, or stand. Secure an exercise band to a stable object in front of you so the band is at shoulder height. Hold one end of the exercise band in each hand. Your palms should face each other. Sit in a stable chair without armrests, or stand. Step back, away from the secured end of the exercise band, until the band stretches. Squeeze your shoulder blades together and lift your hands over your head. Keep your elbows straight. Hold for 3 seconds. Slowly return to the starting position. Repeat for a total of 10 repetitions. Repeat 2 times. Complete this exercise 3 times per week.  This information is not intended to replace advice given to you by your health care provider. Make sure you discuss any questions you have with your health care provider. Document Released: 09/07/2005 Document Revised: 05/14/2016 Document Reviewed: 07/25/2015 Elsevier Interactive Patient Education  2017 [employer].

## 2022-10-07 NOTE — Progress Notes (Signed)
Chief Complaint  Patient presents with   Headache    Daily Would like to order a scan    Vicki Mcdonald is a 27 y.o. female here for evaluation of bilateral (top) headache.  Started around 4-5 years ago, worsening over the past 3 years. Sometimes tolerable and sometimes more severe.  Treatment: None Aura:  Dull pain, sometimes sharp on the R side Palliation: none Provocation: stress, lack of sleep Hx of concussions x3.  Associated symptoms: sonophobia, photophobia Denies: Gum chewing, jaw pain, injury, nausea, vomiting, diplopia, vertigo, ataxia, lacrimation, rhinorrhea Currently with headache? Yes Failed therapies: Tylenol, ibuprofen  BP 108/60 (BP Location: Left Arm, Patient Position: Sitting, Cuff Size: Normal)   Pulse 76   Temp 98.1 F (36.7 C) (Oral)   Ht 5\' 6"  (1.676 m)   Wt 154 lb 4 oz (70 kg)   SpO2 99%   BMI 24.90 kg/m  General: awake, alert, appearing stated age Eyes: PERRLA, EOMi Heart: RRR Lungs: CTAB, no accessory muscle use Neuro: CN 2-12 intact, no cerebellar signs, DTR's equal and symmetry, no clonus MSK: 5/5 strength throughout, normal gait, no TTP over posterior cervical triangle or paraspinal cervical musculature, + TTP over the crown of the head bilaterally in addition to the trapezius musculature bilaterally Psych: Age appropriate judgment and insight, mood and affect normal  Chronic nonintractable headache, unspecified headache type - Plan: MR Brain Wo Contrast, amitriptyline (ELAVIL) 10 MG tablet, rizatriptan (MAXALT) 10 MG tablet  Memory change - Plan: MR Brain Wo Contrast  Chronic, uncontrolled.  Check MRI of the brain.  Start Elavil 10 mg daily.  Maxalt 10 mg daily as needed for abortive therapy.  Stretches and exercises provided for the trapezius musculature. Follow up in 1 month. The patient voiced understanding and agreement to the plan.  [place], [place] 4:49 PM 10/07/22

## 2022-10-12 NOTE — Addendum Note (Signed)
Addended by: Sharon Seller B on: 10/12/2022 11:20 AM   Modules accepted: Orders

## 2022-10-14 ENCOUNTER — Ambulatory Visit (HOSPITAL_BASED_OUTPATIENT_CLINIC_OR_DEPARTMENT_OTHER)
Admission: RE | Admit: 2022-10-14 | Discharge: 2022-10-14 | Disposition: A | Discharge status: Home / Self Care | Payer: Managed Care, Other (non HMO) | Source: Ambulatory Visit | Attending: Family Medicine | Admitting: Family Medicine

## 2022-10-14 DIAGNOSIS — G8929 Other chronic pain: Secondary | ICD-10-CM | POA: Diagnosis present

## 2022-10-14 DIAGNOSIS — R413 Other amnesia: Secondary | ICD-10-CM | POA: Insufficient documentation

## 2022-10-14 DIAGNOSIS — R519 Headache, unspecified: Secondary | ICD-10-CM | POA: Diagnosis not present

## 2024-02-23 ENCOUNTER — Ambulatory Visit (INDEPENDENT_AMBULATORY_CARE_PROVIDER_SITE_OTHER): Admitting: Family Medicine

## 2024-02-23 ENCOUNTER — Other Ambulatory Visit (HOSPITAL_COMMUNITY)
Admission: RE | Admit: 2024-02-23 | Discharge: 2024-02-23 | Disposition: A | Discharge status: Home / Self Care | Source: Ambulatory Visit | Attending: Family Medicine | Admitting: Family Medicine

## 2024-02-23 ENCOUNTER — Encounter: Payer: Self-pay | Admitting: Family Medicine

## 2024-02-23 VITALS — BP 106/78 | HR 84 | Ht 66.0 in | Wt 151.0 lb

## 2024-02-23 DIAGNOSIS — N898 Other specified noninflammatory disorders of vagina: Secondary | ICD-10-CM

## 2024-02-23 DIAGNOSIS — R300 Dysuria: Secondary | ICD-10-CM

## 2024-02-23 LAB — POC URINALSYSI DIPSTICK (AUTOMATED)
Bilirubin, UA: NEGATIVE
Blood, UA: NEGATIVE
Glucose, UA: NEGATIVE
Ketones, UA: NEGATIVE
Nitrite, UA: NEGATIVE
Protein, UA: NEGATIVE
Spec Grav, UA: 1.02 (ref 1.010–1.025)
Urobilinogen, UA: 0.2 U/dL
pH, UA: 6 (ref 5.0–8.0)

## 2024-02-23 MED ORDER — SULFAMETHOXAZOLE-TRIMETHOPRIM 800-160 MG PO TABS: 1.0000 | ORAL_TABLET | Freq: Two times a day (BID) | ORAL | 0 refills | Status: AC

## 2024-02-23 NOTE — Progress Notes (Signed)
 Acute Office Visit  Subjective:     Patient ID: Vicki Mcdonald, female    DOB: 1995-12-13, 28 y.o.   MRN: [id number]  No chief complaint on file.   HPI Patient is in today for urinary/vaginal symptoms.     Discussed the use of AI scribe software for clinical note transcription with the patient, who gave verbal consent to proceed.  History of Present Illness Vicki Mcdonald is a 28 year old female with a history of yeast infections and UTIs who presents with vaginal and urinary discomfort.  She has been experiencing vaginal and urinary discomfort since Saturday or Sunday, initially described as 'itchy' and sensitive to touch. By yesterday or this morning, she began experiencing dysuria.  She performed an at-home test which was positive for a UTI and showed a faint result for a yeast infection. She treated herself with an over-the-counter yeast infection suppository, which she believes may have caused irritation.  No abdominal or back pain, but there is urinary urgency. No fever has been experienced.   She reports vaginal discharge that is creamy, whitish, gray, or yellow, without any noticeable odor and no blood. She is not currently sexually active.       ROS All review of systems negative except what is listed in the HPI      Objective:    BP 106/78   Pulse 84   Ht 5\' 6"  (1.676 m)   Wt 151 lb (68.5 kg)   SpO2 100%   BMI 24.37 kg/m    Physical Exam Vitals reviewed.  Constitutional:      Appearance: Normal appearance.  Neurological:     Mental Status: She is alert and oriented to person, place, and time.  Psychiatric:        Mood and Affect: Mood normal.        Behavior: Behavior normal.        Thought Content: Thought content normal.        Judgment: Judgment normal.       Results for orders placed or performed in visit on 02/23/24  POCT Urinalysis Dipstick (Automated)  Result Value Ref Range   Color, UA yellow    Clarity, UA clear    Glucose, UA  Negative Negative   Bilirubin, UA negative    Ketones, UA negative    Spec Grav, UA 1.020 1.010 - 1.025   Blood, UA negative    pH, UA 6.0 5.0 - 8.0   Protein, UA Negative Negative   Urobilinogen, UA 0.2 0.2 or 1.0 E.U./dL   Nitrite, UA negative    Leukocytes, UA Moderate (2+) (A) Negative        Assessment & Plan:   Problem List Items Addressed This Visit   None Visit Diagnoses       Dysuria    -  Primary   Relevant Medications   sulfamethoxazole-trimethoprim (BACTRIM DS) 800-160 MG tablet   Other Relevant Orders   POCT Urinalysis Dipstick (Automated) (Completed)   Urine Culture     Vaginal irritation       Relevant Orders   Cervicovaginal ancillary only      Bactrim for possible UTI - adjust tx pending culture Vaginal swab pending - declined STD screening Supportive measures encouraged.     Meds ordered this encounter  Medications   sulfamethoxazole-trimethoprim (BACTRIM DS) 800-160 MG tablet    Sig: Take 1 tablet by mouth 2 (two) times daily for 3 days.    Dispense:  6 tablet  Refill:  0    Supervising Provider:   Randie Bustle A [[id number]]    Return if symptoms worsen or fail to improve.  Everlina Hock, NP

## 2024-02-24 ENCOUNTER — Ambulatory Visit: Payer: Self-pay | Admitting: Family Medicine

## 2024-02-24 DIAGNOSIS — B3731 Acute candidiasis of vulva and vagina: Secondary | ICD-10-CM

## 2024-02-24 LAB — CERVICOVAGINAL ANCILLARY ONLY
Bacterial Vaginitis (gardnerella): NEGATIVE
Candida Glabrata: NEGATIVE
Candida Vaginitis: POSITIVE — AB
Comment: NEGATIVE
Comment: NEGATIVE
Comment: NEGATIVE

## 2024-02-24 MED ORDER — BORIC ACID VAGINAL 600 MG VA SUPP: 600.0000 mg | Freq: Every evening | VAGINAL | 0 refills | Status: AC

## 2024-07-07 ENCOUNTER — Ambulatory Visit (INDEPENDENT_AMBULATORY_CARE_PROVIDER_SITE_OTHER): Admitting: Family

## 2024-07-07 VITALS — BP 102/60 | HR 78 | Resp 18 | Ht 66.0 in | Wt 148.0 lb

## 2024-07-07 DIAGNOSIS — N644 Mastodynia: Secondary | ICD-10-CM

## 2024-07-07 DIAGNOSIS — R6889 Other general symptoms and signs: Secondary | ICD-10-CM | POA: Diagnosis not present

## 2024-07-07 LAB — POCT URINE PREGNANCY: Preg Test, Ur: NEGATIVE

## 2024-07-07 LAB — CBC WITH DIFFERENTIAL/PLATELET
Basophils Absolute: 0 K/uL (ref 0.0–0.1)
Basophils Relative: 0.5 % (ref 0.0–3.0)
Eosinophils Absolute: 0.1 K/uL (ref 0.0–0.7)
Eosinophils Relative: 1.7 % (ref 0.0–5.0)
HCT: 40.4 % (ref 36.0–46.0)
Hemoglobin: 13.7 g/dL (ref 12.0–15.0)
Lymphocytes Relative: 22.8 % (ref 12.0–46.0)
Lymphs Abs: 2 K/uL (ref 0.7–4.0)
MCHC: 33.9 g/dL (ref 30.0–36.0)
MCV: 89 fl (ref 78.0–100.0)
Monocytes Absolute: 0.6 K/uL (ref 0.1–1.0)
Monocytes Relative: 6.5 % (ref 3.0–12.0)
Neutro Abs: 5.9 K/uL (ref 1.4–7.7)
Neutrophils Relative %: 68.5 % (ref 43.0–77.0)
Platelets: 195 K/uL (ref 150.0–400.0)
RBC: 4.53 Mil/uL (ref 3.87–5.11)
RDW: 12.5 % (ref 11.5–15.5)
WBC: 8.6 K/uL (ref 4.0–10.5)

## 2024-07-07 LAB — TSH: TSH: 0.87 u[IU]/mL (ref 0.35–5.50)

## 2024-07-08 ENCOUNTER — Ambulatory Visit: Payer: Self-pay | Admitting: Family

## 2024-07-09 NOTE — Progress Notes (Signed)
 Acute Office Visit  Subjective:     Patient ID: Vicki Mcdonald, female    DOB: 1995/12/11, 28 y.o.   MRN: [id number]  No chief complaint on file.   HPI Patient is in today with c/o her body feeling warm, breast tenderness, feeling like her skin is itching over the last week. She feels like her hormones are off. Is also concerned about the possibility of pregnancy or hyperthyroidism. Her mother has hyperthyroidism. She has not missed a menstrual cycle and has taken a pregnancy test at home that was negative. Patient has a history of Bipolar disorder and has stopped taking her medication about 1 month ago. She reports misisng her appointment to see her psychiatrist. She has not tried to reschedule as she says they had a plan to d/c her psychiatric medication anyway. She denies feeling depressed or manic.   Review of Systems  Cardiovascular:        Breast tenderness  Skin:  Positive for itching.  Endo/Heme/Allergies:        Body warmer than usual  Psychiatric/Behavioral:  Negative for suicidal ideas. The patient is not nervous/anxious.        Off psychiatric meds  All other systems reviewed and are negative.   Past Medical History:  Diagnosis Date  . Anemia   . Anxiety    no meds  . Blood transfusion without reported diagnosis 2014   x3  . Hemoglobin C trait   . History of blood transfusion 01/17/2014   X 3 09/2012     Social History   Socioeconomic History  . Marital status: Married    Spouse name: Not on file  . Number of children: Not on file  . Years of education: Not on file  . Highest education level: Not on file  Occupational History  . Not on file  Tobacco Use  . Smoking status: Never  . Smokeless tobacco: Never  Substance and Sexual Activity  . Alcohol use: No  . Drug use: No  . Sexual activity: Yes    Birth control/protection: None  Other Topics Concern  . Not on file  Social History Narrative  . Not on file   Social Drivers of Health   Financial  Resource Strain: Not on file  Food Insecurity: Not on file  Transportation Needs: Not on file  Physical Activity: Not on file  Stress: Not on file  Social Connections: Unknown (02/02/2022)   Received from [facility]   Social Network   . Social Network: Not on file  Intimate Partner Violence: Unknown (12/25/2021)   [facility] from [facility]   HITS   . Physically Hurt: Not on file   . Insult or Talk Down To: Not on file   . Threaten Physical Harm: Not on file   . Scream or Curse: Not on file    Past Surgical History:  Procedure Laterality Date  . NO PAST SURGERIES      Family History  Problem Relation Age of Onset  . Thyroid  disease Mother   . Diabetes Maternal Grandmother   . Cancer Maternal Grandmother        cervical  . Cancer Maternal Grandfather   . Hearing loss Neg Hx     Allergies  Allergen Reactions  . Diflucan [Fluconazole] Hives    Current Outpatient Medications on File Prior to Visit  Medication Sig Dispense Refill  . Boric Acid Vaginal 600 MG SUPP Place 600 mg vaginally at bedtime. Continue for 14 days 24 suppository  0  . buPROPion (WELLBUTRIN XL) 150 MG 24 hr tablet Take 150 mg by mouth daily.    . QUEtiapine (SEROQUEL XR) 50 MG TB24 24 hr tablet Take 100 mg by mouth at bedtime.     No current facility-administered medications on file prior to visit.    BP 102/60   Pulse 78   Resp 18   Ht 5' 6 (1.676 m)   Wt 148 lb (67.1 kg)   LMP 06/21/2024   SpO2 100%   BMI 23.89 kg/m chart     Objective:    BP 102/60   Pulse 78   Resp 18   Ht 5' 6 (1.676 m)   Wt 148 lb (67.1 kg)   LMP 06/21/2024   SpO2 100%   BMI 23.89 kg/m    Physical Exam Vitals and nursing note reviewed.  Constitutional:      Appearance: She is obese.  Cardiovascular:     Rate and Rhythm: Normal rate and regular rhythm.     Pulses: Normal pulses.     Heart sounds: Normal heart sounds.  Pulmonary:     Effort: Pulmonary effort is normal.     Breath sounds: Normal  breath sounds.  Musculoskeletal:        General: Normal range of motion.  Skin:    General: Skin is warm and dry.  Neurological:     General: No focal deficit present.     Mental Status: She is alert and oriented to person, place, and time. Mental status is at baseline.  Psychiatric:        Mood and Affect: Mood normal.        Thought Content: Thought content normal.    Results for orders placed or performed in visit on 07/07/24  TSH  Result Value Ref Range   TSH 0.87 0.35 - 5.50 uIU/mL  CBC w/Diff  Result Value Ref Range   WBC 8.6 4.0 - 10.5 K/uL   RBC 4.53 3.87 - 5.11 Mil/uL   Hemoglobin 13.7 12.0 - 15.0 g/dL   HCT 59.5 63.9 - 53.9 %   MCV 89.0 78.0 - 100.0 fl   MCHC 33.9 30.0 - 36.0 g/dL   RDW 87.4 88.4 - 84.4 %   Platelets 195.0 150.0 - 400.0 K/uL   Neutrophils Relative % 68.5 43.0 - 77.0 %   Lymphocytes Relative 22.8 12.0 - 46.0 %   Monocytes Relative 6.5 3.0 - 12.0 %   Eosinophils Relative 1.7 0.0 - 5.0 %   Basophils Relative 0.5 0.0 - 3.0 %   Neutro Abs 5.9 1.4 - 7.7 K/uL   Lymphs Abs 2.0 0.7 - 4.0 K/uL   Monocytes Absolute 0.6 0.1 - 1.0 K/uL   Eosinophils Absolute 0.1 0.0 - 0.7 K/uL   Basophils Absolute 0.0 0.0 - 0.1 K/uL  POCT urine pregnancy  Result Value Ref Range   Preg Test, Ur Negative Negative        Assessment & Plan:   Problem List Items Addressed This Visit   None Visit Diagnoses       Heat intolerance    -  Primary   Relevant Orders   TSH (Completed)   CBC w/Diff (Completed)     Breast tenderness in female       Relevant Orders   POCT urine pregnancy (Completed)   TSH (Completed)   CBC w/Diff (Completed)       No orders of the defined types were placed in this encounter.  Labs obtained.  Will notify patient pending results. I strongly suspect that her symptoms are related to d/c her Seroquel and Wellbutrin. Advised to schedule an appointment to see her psychiatrist again.   Heinz Eckert B Andrw Mcguirt, FNP

## 2024-07-12 ENCOUNTER — Telehealth: Payer: Self-pay

## 2024-07-12 NOTE — Telephone Encounter (Signed)
 Patient called into schedule a New OB but patient has an IUD but cannot feel the strings. Routing to Dr. Barbra for direction.

## 2024-07-21 ENCOUNTER — Emergency Department (HOSPITAL_BASED_OUTPATIENT_CLINIC_OR_DEPARTMENT_OTHER)
Admission: EM | Admit: 2024-07-21 | Discharge: 2024-07-21 | Disposition: A | Discharge status: Home / Self Care | Attending: Emergency Medicine | Admitting: Emergency Medicine

## 2024-07-21 ENCOUNTER — Other Ambulatory Visit: Payer: Self-pay

## 2024-07-21 ENCOUNTER — Emergency Department (HOSPITAL_BASED_OUTPATIENT_CLINIC_OR_DEPARTMENT_OTHER)

## 2024-07-21 ENCOUNTER — Encounter (HOSPITAL_BASED_OUTPATIENT_CLINIC_OR_DEPARTMENT_OTHER): Payer: Self-pay

## 2024-07-21 ENCOUNTER — Ambulatory Visit: Admitting: Obstetrics and Gynecology

## 2024-07-21 VITALS — BP 101/75 | HR 61 | Ht 66.0 in | Wt 146.0 lb

## 2024-07-21 DIAGNOSIS — Z3202 Encounter for pregnancy test, result negative: Secondary | ICD-10-CM | POA: Diagnosis not present

## 2024-07-21 DIAGNOSIS — Z30433 Encounter for removal and reinsertion of intrauterine contraceptive device: Secondary | ICD-10-CM

## 2024-07-21 DIAGNOSIS — R109 Unspecified abdominal pain: Secondary | ICD-10-CM | POA: Insufficient documentation

## 2024-07-21 DIAGNOSIS — N939 Abnormal uterine and vaginal bleeding, unspecified: Secondary | ICD-10-CM | POA: Diagnosis present

## 2024-07-21 DIAGNOSIS — N83202 Unspecified ovarian cyst, left side: Secondary | ICD-10-CM | POA: Insufficient documentation

## 2024-07-21 DIAGNOSIS — T8332XA Displacement of intrauterine contraceptive device, initial encounter: Secondary | ICD-10-CM

## 2024-07-21 LAB — HCG, QUANTITATIVE, PREGNANCY: hCG, Beta Chain, Quant, S: <1 m[IU]/mL (ref <5)

## 2024-07-21 LAB — POCT URINE PREGNANCY: Preg Test, Ur: NEGATIVE

## 2024-07-21 NOTE — Progress Notes (Signed)
   ESTABLISHED GYNECOLOGY VISIT Chief Complaint  Patient presents with   IUD Removal and Reinsertion    Subjective:  Vicki Mcdonald is a 28 y.o. [id number] presenting for IUD removal and new IUD  Has had Liletta  IUD since 2020. States strings have been not visible since then. Had ultrasound in 2020 that showed IUD in appropriate location.   She reports that she started having symptoms of pregnancy two weeks ago including breast tenderness, nausea, took a pregnancy test as follows:  07/07/2024: Negative UPT 07/08/24: Positive UPT 07/09/24: Positive UPT 07/12/24: Positive UPT  Reports she waited only 3 minutes and discarded before 10 minutes as per package instructions. Reports that each day, the positive line became progressively darker. She reports she started having bleeding on 10/23 and then her pregnancy symptoms improved/resolved.   Her urine pregnancy test here today is negative.  She reports no pelvic pain.    Review of Systems:   Pertinent items are noted in HPI  Pertinent History Reviewed:  Reviewed past medical,surgical, social and family history.  Reviewed problem list, medications and allergies.  Objective:   Vitals:   07/21/24 0934  BP: 101/75  Pulse: 61  Weight: 146 lb (66.2 kg)  Height: 5' 6 (1.676 m)   Physical Examination:   General appearance - well appearing, and in no distress  Mental status - alert, oriented to person, place, and time  Psych:  normal mood and affect  Skin - warm and dry, normal color, no suspicious lesions noted  Abdomen - soft, nontender, nondistended, no masses or organomegaly  Pelvic -  VULVA: normal appearing vulva with no masses, tenderness or lesions   VAGINA: normal appearing vagina with normal color and discharge, no lesions   CERVIX: normal appearing cervix without discharge or lesions, no IUD strings visible, cytobrush used with no strings revealed   UTERUS: uterus is felt to be normal size, shape, consistency and  nontender    ADNEXA: No adnexal masses or tenderness noted.  Extremities:  No swelling or varicosities noted  Chaperone present for exam  Limited bedside abdominal ultrasound shows IUD in uterus and left adnexal cyst, simple in appearance, approximately 5 cm  Assessment and Plan:  1. Intrauterine contraceptive device threads lost, initial encounter (Primary) Strings not visible on exam, limited bedside ultrasound suggests IUD in uterus however unable to determine if malpositioned. It is concerning that she has had multiple positive home pregnancy tests with potentially malpositioned IUD. Her urine HCG is negative today. We discussed limitations of urine pregnancy tests and threshold for HCG detection. I reviewed risks of ectopic pregnancy. She is asymptomatic at this time.  I recommend that she have stat HCG with ultrasound for confirmation of IUD location today. I discussed options for obtaining this and she chooses to go downstairs to [facility] ED. ER provider notified of patient. If HCG is negative and IUD confirmed in uterus, she can return to office for appt for IUD exchange - POCT urine pregnancy  2. Urine pregnancy test negative    No follow-ups on file.  No future appointments.  Rollo ONEIDA Bring, MD, FACOG Obstetrician & Gynecologist, [facility] for [facility], [facility]

## 2024-07-21 NOTE — Progress Notes (Signed)
 Patient presents to the office for an IUD replacement. Patient reports the following UPT's:   07/07/2024: Negative UPT 07/08/24: Positive UPT 07/09/24: Positive UPT 07/12/24: Positive UPT  Began bleeding: 07/13/2024 Pain on right lower quadrant, now has subsided  Liletta  placed in 2020 by Dr. Barbra  Last pap: 08/31/2016

## 2024-07-21 NOTE — ED Provider Notes (Signed)
 [facility] Provider Note   CSN: [id number] Arrival date & time: 07/21/24  1015     Patient presents with: IUD concerns   Niema Carrara is a 28 y.o. female.   Patient presents from clinic due to positive home pregnancy test but negative urine test in the office.  Unable to visualize IUD string by [facility],The doctor on external exam.  Patient has mild vaginal bleeding.  No discharge.  No fevers or chills.  The history is provided by the patient.       Prior to Admission medications   Medication Sig Start Date End Date Taking? Authorizing Provider  Boric Acid Vaginal 600 MG SUPP Place 600 mg vaginally at bedtime. Continue for 14 days Patient not taking: Reported on 07/21/2024 02/24/24   Almarie Waddell NOVAK, NP    Allergies: Diflucan [fluconazole]    Review of Systems  Constitutional:  Negative for chills and fever.  HENT:  Negative for congestion.   Eyes:  Negative for visual disturbance.  Respiratory:  Negative for shortness of breath.   Cardiovascular:  Negative for chest pain.  Gastrointestinal:  Negative for abdominal pain and vomiting.  Genitourinary:  Negative for dysuria and flank pain.  Musculoskeletal:  Negative for back pain, neck pain and neck stiffness.  Skin:  Negative for rash.  Neurological:  Negative for light-headedness and headaches.    Updated Vital Signs BP 106/68   Pulse (!) 57   Temp 98.4 F (36.9 C) (Oral)   Resp 18   Wt 66.2 kg   LMP 06/14/2024   SpO2 91%   BMI 23.57 kg/m   Physical Exam Vitals and nursing note reviewed.  Constitutional:      General: She is not in acute distress.    Appearance: She is well-developed.  HENT:     Head: Normocephalic and atraumatic.     Mouth/Throat:     Mouth: Mucous membranes are moist.  Eyes:     General:        Right eye: No discharge.        Left eye: No discharge.     Conjunctiva/sclera: Conjunctivae normal.  Neck:     Trachea: No tracheal deviation.   Cardiovascular:     Rate and Rhythm: Normal rate.  Pulmonary:     Effort: Pulmonary effort is normal.  Abdominal:     General: There is no distension.     Palpations: Abdomen is soft.     Tenderness: There is no abdominal tenderness. There is no guarding.  Musculoskeletal:     Cervical back: Normal range of motion and neck supple. No rigidity.  Skin:    General: Skin is warm.     Capillary Refill: Capillary refill takes less than 2 seconds.     Findings: No rash.  Neurological:     General: No focal deficit present.     Mental Status: She is alert.  Psychiatric:        Mood and Affect: Mood normal.     (all labs ordered are listed, but only abnormal results are displayed) Labs Reviewed  HCG, QUANTITATIVE, PREGNANCY    EKG: None  Radiology: US  PELVIC COMPLETE W TRANSVAGINAL AND TORSION R/O Result Date: 07/21/2024 EXAM: US  Pelvis, Complete Transvaginal and Transabdominal with Doppler 07/21/2024 12:05:51 PM TECHNIQUE: Transabdominal and transvaginal pelvic duplex ultrasound using B-mode/gray scaled imaging with Doppler spectral analysis and color flow was obtained. COMPARISON: None available CLINICAL HISTORY: [id number] IUD check up FINDINGS: UTERUS: Uterus measures  9.4 x 4.0 x 5.4 cm. Intrauterine contraceptive device appears appropriately positioned within the endometrial cavity. Uterus demonstrates normal myometrial echotexture. ENDOMETRIAL STRIPE: Endometrial stripe measures 4 mm. Endometrial stripe is within normal limits. RIGHT OVARY: Right ovary measures 2.9 x 1.9 x 2.4 cm. Right ovary is within normal limits. There is normal arterial and venous Doppler flow. LEFT OVARY: Left ovary measures 6.4 x 4.2 x 4.6 cm. There is a left ovarian cyst measuring 5.7 x 3.8 x 3.7 cm. There is normal arterial and venous Doppler flow. FREE FLUID: No free fluid. IMPRESSION: 1. Probable benign left ovarian cyst measuring 5.7 x 3.8 x 3.7 cm. Recommend follow-up ultrasound in 36 months. 2. Intrauterine  contraceptive device appropriately positioned within the uterine cavity. Electronically signed by: Franky Stanford MD 07/21/2024 12:35 PM EDT RP Workstation: [id number]     Procedures   Medications Ordered in the ED - No data to display                                  Medical Decision Making Amount and/or Complexity of Data Reviewed Labs: ordered. Radiology: ordered.   Well-appearing patient presents emergency room for hCG blood testing and formal ultrasound.  Blood test reviewed results negative hCG and ultrasound results independently reviewed showing left ovarian cyst, IUD in place.  Patient is here for discharge and outpatient follow-up with OB/GYN.     Final diagnoses:  Left ovarian cyst    ED Discharge Orders     None          Tonia Chew, MD 07/21/24 1256

## 2024-07-21 NOTE — ED Notes (Signed)
No pain noted

## 2024-07-21 NOTE — ED Triage Notes (Signed)
 Pt sent from OB/GYN. Pt had 3 positive pregnancy test at home. Neg test today. DR unable to find IUD string. Some vaginal bleeding

## 2024-07-21 NOTE — Discharge Instructions (Addendum)
 Follow-up with your OB/GYN physician next week for reassessment. Use Tylenol  every 4 as needed for pain. Your pregnancy test was negative.

## 2024-08-02 ENCOUNTER — Encounter

## 2024-09-19 ENCOUNTER — Telehealth: Payer: Self-pay | Admitting: *Deleted

## 2024-09-19 MED ORDER — OXYCODONE-ACETAMINOPHEN 5-325 MG PO TABS: 1.0000 | ORAL_TABLET | Freq: Once | ORAL | 0 refills | Status: AC

## 2024-09-19 NOTE — Telephone Encounter (Signed)
 I called pt and made her aware of what Dr. Garen advised for her procedure. Thank you

## 2024-09-19 NOTE — Addendum Note (Signed)
 Addended by: ALGER GONG on: 09/19/2024 01:16 PM   Modules accepted: Orders

## 2024-09-19 NOTE — Telephone Encounter (Signed)
 Patient is requesting to be prescribed something for pain prior to removal of non visible IUD strings on 09/20/2024.

## 2024-09-20 ENCOUNTER — Encounter: Payer: Self-pay | Admitting: Obstetrics and Gynecology

## 2024-09-20 ENCOUNTER — Ambulatory Visit: Admitting: Obstetrics and Gynecology

## 2024-09-20 ENCOUNTER — Other Ambulatory Visit (HOSPITAL_COMMUNITY)
Admission: RE | Admit: 2024-09-20 | Discharge: 2024-09-20 | Disposition: A | Discharge status: Home / Self Care | Source: Ambulatory Visit | Attending: Obstetrics and Gynecology | Admitting: Obstetrics and Gynecology

## 2024-09-20 VITALS — BP 108/73 | HR 62 | Ht 66.0 in | Wt 158.0 lb

## 2024-09-20 DIAGNOSIS — Z01419 Encounter for gynecological examination (general) (routine) without abnormal findings: Secondary | ICD-10-CM | POA: Diagnosis present

## 2024-09-20 DIAGNOSIS — Z30433 Encounter for removal and reinsertion of intrauterine contraceptive device: Secondary | ICD-10-CM | POA: Diagnosis not present

## 2024-09-20 DIAGNOSIS — Z30430 Encounter for insertion of intrauterine contraceptive device: Secondary | ICD-10-CM

## 2024-09-20 DIAGNOSIS — Z3202 Encounter for pregnancy test, result negative: Secondary | ICD-10-CM | POA: Diagnosis not present

## 2024-09-20 LAB — POCT URINE PREGNANCY: Preg Test, Ur: NEGATIVE

## 2024-09-20 MED ORDER — LEVONORGESTREL 20 MCG/DAY IU IUD
1.0000 | INTRAUTERINE_SYSTEM | Freq: Once | INTRAUTERINE | Status: AC
  Administered 2024-09-20: 1 via INTRAUTERINE

## 2024-09-20 NOTE — Progress Notes (Signed)
 28 yo P2 here for IUD removal and reinsertion. Patient has had Liletta  IUD since 2020 with strings not visible on last exam. Pelvic ultrasound confirms IUD is in the appropriate location. Patient desires to have Mirena  IUD inserted today  Past Medical History:  Diagnosis Date   Anemia    Anxiety    no meds   Blood transfusion without reported diagnosis 2014   x3   Hemoglobin C trait    History of blood transfusion 01/17/2014   X 3 09/2012    Past Surgical History:  Procedure Laterality Date   NO PAST SURGERIES     Family History  Problem Relation Age of Onset   Thyroid  disease Mother    Diabetes Maternal Grandmother    Cancer Maternal Grandmother        cervical   Cancer Maternal Grandfather    Hearing loss Neg Hx    Social History   Socioeconomic History   Marital status: Married    Spouse name: Not on file   Number of children: Not on file   Years of education: Not on file   Highest education level: Not on file  Occupational History   Not on file  Tobacco Use   Smoking status: Never   Smokeless tobacco: Never  Vaping Use   Vaping status: Never Used  Substance and Sexual Activity   Alcohol use: No   Drug use: Yes    Types: Marijuana   Sexual activity: Yes    Birth control/protection: I.U.D.  Other Topics Concern   Not on file  Social History Narrative   Not on file   Social Drivers of Health   Tobacco Use: Low Risk (09/20/2024)   Patient History    Smoking Tobacco Use: Never    Smokeless Tobacco Use: Never    Passive Exposure: Not on file  Financial Resource Strain: Not on file  Food Insecurity: Not on file  Transportation Needs: Not on file  Physical Activity: Not on file  Stress: Not on file  Social Connections: Not on file  Depression (EYV7-0): Not on file  Alcohol Screen: Not on file  Housing: Not on file  Utilities: Not on file  Health Literacy: Not on file   ROS See pertinent in HPI. All other systems reviewed and non contributory Blood  pressure 108/73, pulse 62, height 5' 6 (1.676 m), weight 158 lb (71.7 kg).  GENERAL: Well-developed, well-nourished female in no acute distress.  ABDOMEN: Soft, nontender, nondistended. No organomegaly. PELVIC: Normal external female genitalia. Vagina is pink and rugated.  Normal discharge. Normal appearing cervix and strings not visualized at the os. Uterus is normal in size. No adnexal mass or tenderness. Chaperone present during the pelvic exam EXTREMITIES: No cyanosis, clubbing, or edema, 2+ distal pulses.  A/P 28 yo here for IUD removal and insertion - Pap smear collected today for cervical cancer screening as patient was overdue  GYNECOLOGY CLINIC PROCEDURE NOTE  Hara Milholland is a 28 y.o. [phone] here for IUD removal. No GYN concerns.  Last pap smear was in 2017 and was normal.  IUD Removal  Patient identified, informed consent performed, consent signed.  Patient was in the dorsal lithotomy position, normal external genitalia was noted.  A speculum was placed in the patient's vagina, normal discharge was noted, no lesions. The cervix was visualized, no lesions, no abnormal discharge.  Lidocaine  spray applied on the cervix. The strings of the IUD were not visualized, so Kelly forceps were introduced into the endometrial cavity and  the IUD was grasped and removed in its entirety after the first attempt.  Patient tolerated the procedure well.    IUD Procedure Note The cervix was cleaned with Betadine x 2. Mirena  IUD placed per manufacturer's recommendations to a depth of 9 cm.  Strings trimmed to 3 cm. Tenaculum was removed, good hemostasis noted.  Patient tolerated procedure well.   Patient given post procedure instructions and Mirena  care card with expiration date.  Patient is asked to check IUD strings periodically and follow up in 4-6 weeks for IUD check.

## 2024-09-20 NOTE — Progress Notes (Signed)
 Verbal given by Dr. Alger for 800mg  Ibuprofen  given to patient post IUD insertion and removal.  NDC [id number] Lot [id number] Exp 06/21/2025  Silvano LELON Piano, RN

## 2024-09-25 LAB — CYTOLOGY - PAP
Chlamydia: NEGATIVE
Comment: NEGATIVE
Comment: NORMAL
Diagnosis: NEGATIVE
Neisseria Gonorrhea: NEGATIVE

## 2024-10-02 ENCOUNTER — Ambulatory Visit: Admitting: Obstetrics and Gynecology

## 2024-10-13 ENCOUNTER — Encounter: Payer: Self-pay | Admitting: Family Medicine

## 2024-10-13 ENCOUNTER — Other Ambulatory Visit (HOSPITAL_COMMUNITY)
Admission: RE | Admit: 2024-10-13 | Discharge: 2024-10-13 | Disposition: A | Payer: Self-pay | Source: Ambulatory Visit | Attending: Family Medicine | Admitting: Family Medicine

## 2024-10-13 ENCOUNTER — Ambulatory Visit: Payer: Self-pay | Admitting: Family Medicine

## 2024-10-13 VITALS — BP 110/74 | HR 73 | Temp 98.0°F | Resp 16 | Ht 66.0 in | Wt 157.4 lb

## 2024-10-13 DIAGNOSIS — Z114 Encounter for screening for human immunodeficiency virus [HIV]: Secondary | ICD-10-CM

## 2024-10-13 DIAGNOSIS — Z113 Encounter for screening for infections with a predominantly sexual mode of transmission: Secondary | ICD-10-CM | POA: Insufficient documentation

## 2024-10-13 DIAGNOSIS — Z1159 Encounter for screening for other viral diseases: Secondary | ICD-10-CM

## 2024-10-13 LAB — HEPATITIS C ANTIBODY: Hepatitis C Ab: NONREACTIVE

## 2024-10-13 LAB — HIV ANTIBODY (ROUTINE TESTING W REFLEX)
HIV 1&2 Ab, 4th Generation: NONREACTIVE
HIV FINAL INTERPRETATION: NEGATIVE

## 2024-10-13 NOTE — Patient Instructions (Signed)
 Give me a few days to get your results back to you.  Practice safe sex.  Let us  know if you need anything.

## 2024-10-13 NOTE — Progress Notes (Signed)
 Chief Complaint  Patient presents with   Exposure to STD    STD Testing    Subjective: Patient is a 29 y.o. female here for STD testing.  Patient denies any urinary complaints, bleeding, genital skin changes, discharge, fevers, or abdominal pain.  No history of HIV or STIs. Broke up with partner of 6 mo. No concerns from him.   Past Medical History:  Diagnosis Date   Anemia    Anxiety    no meds   Blood transfusion without reported diagnosis 2014   x3   Hemoglobin C trait    History of blood transfusion 01/17/2014   X 3 09/2012     Objective: BP 110/74 (BP Location: Left Arm, Patient Position: Sitting)   Pulse 73   Temp 98 F (36.7 C) (Oral)   Resp 16   Ht 5' 6 (1.676 m)   Wt 157 lb 6.4 oz (71.4 kg)   SpO2 99%   BMI 25.41 kg/m  General: Awake, appears stated age Heart: RRR, no LE edema Lungs: CTAB, no rales, wheezes or rhonchi. No accessory muscle use Abd: BS+, S, NT, ND Psych: Age appropriate judgment and insight, normal affect and mood  Assessment and Plan: Screening examination for STI - Plan: Cervicovaginal ancillary only( [place])  Screening for HIV without presence of risk factors - Plan: HIV Antibody (routine testing w rflx)  Encounter for hepatitis C screening test for low risk patient - Plan: Hepatitis C antibody  Check above.  Practice safe sex. The patient voiced understanding and agreement to the plan.  Mabel [place], DO 10/13/24  11:06 AM

## 2024-10-14 ENCOUNTER — Ambulatory Visit: Payer: Self-pay | Admitting: Family Medicine

## 2024-10-16 LAB — CERVICOVAGINAL ANCILLARY ONLY
Chlamydia: NEGATIVE
Comment: NEGATIVE
Comment: NEGATIVE
Comment: NORMAL
Neisseria Gonorrhea: NEGATIVE
Trichomonas: NEGATIVE

## 2024-10-17 ENCOUNTER — Ambulatory Visit: Admitting: Obstetrics and Gynecology
